# Patient Record
Sex: Female | Born: 1950
Health system: Southern US, Community
[De-identification: ages and names within clinical notes are randomized; demographics above are authoritative.]

## PROBLEM LIST (undated history)

## (undated) ENCOUNTER — Ambulatory Visit: Discharge status: Absent without leave | Payer: PPO

## (undated) DIAGNOSIS — Z9841 Cataract extraction status, right eye: Secondary | ICD-10-CM

## (undated) DIAGNOSIS — K219 Gastro-esophageal reflux disease without esophagitis: Secondary | ICD-10-CM

## (undated) DIAGNOSIS — H269 Unspecified cataract: Secondary | ICD-10-CM

## (undated) DIAGNOSIS — F32A Depression, unspecified: Secondary | ICD-10-CM

## (undated) DIAGNOSIS — F419 Anxiety disorder, unspecified: Secondary | ICD-10-CM

## (undated) DIAGNOSIS — Z9109 Other allergy status, other than to drugs and biological substances: Secondary | ICD-10-CM

## (undated) DIAGNOSIS — J45909 Unspecified asthma, uncomplicated: Secondary | ICD-10-CM

## (undated) DIAGNOSIS — E039 Hypothyroidism, unspecified: Secondary | ICD-10-CM

## (undated) DIAGNOSIS — J3801 Paralysis of vocal cords and larynx, unilateral: Secondary | ICD-10-CM

## (undated) DIAGNOSIS — J309 Allergic rhinitis, unspecified: Secondary | ICD-10-CM

## (undated) DIAGNOSIS — E785 Hyperlipidemia, unspecified: Secondary | ICD-10-CM

## (undated) DIAGNOSIS — J4540 Moderate persistent asthma, uncomplicated: Secondary | ICD-10-CM

## (undated) DIAGNOSIS — J3800 Paralysis of vocal cords and larynx, unspecified: Secondary | ICD-10-CM

## (undated) DIAGNOSIS — T8859XA Other complications of anesthesia, initial encounter: Secondary | ICD-10-CM

## (undated) DIAGNOSIS — G4700 Insomnia, unspecified: Secondary | ICD-10-CM

## (undated) DIAGNOSIS — R42 Dizziness and giddiness: Secondary | ICD-10-CM

## (undated) DIAGNOSIS — Z7982 Long term (current) use of aspirin: Secondary | ICD-10-CM

## (undated) DIAGNOSIS — G2581 Restless legs syndrome: Secondary | ICD-10-CM

## (undated) HISTORY — PX: BREAST BIOPSY: SHX20

## (undated) HISTORY — PX: APPENDECTOMY: SHX54

## (undated) HISTORY — DX: Unspecified cataract: H26.9

## (undated) HISTORY — DX: Allergic rhinitis, unspecified: J30.9

## (undated) HISTORY — DX: Moderate persistent asthma, uncomplicated: J45.40

## (undated) HISTORY — DX: Other allergy status, other than to drugs and biological substances: Z91.09

## (undated) HISTORY — DX: Paralysis of vocal cords and larynx, unilateral: J38.01

## (undated) HISTORY — DX: Gastro-esophageal reflux disease without esophagitis: K21.9

## (undated) HISTORY — DX: Hypothyroidism, unspecified: E03.9

## (undated) HISTORY — DX: Paralysis of vocal cords and larynx, unspecified: J38.00

## (undated) HISTORY — PX: VAGINAL HYSTERECTOMY: SUR661

## (undated) HISTORY — PX: OTHER SURGICAL HISTORY: SHX169

---
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
## 1983-11-11 HISTORY — PX: AUGMENTATION MAMMAPLASTY: SUR837

## 1992-11-10 HISTORY — PX: BACK SURGERY: SHX140

## 1993-11-10 DIAGNOSIS — J3801 Paralysis of vocal cords and larynx, unilateral: Secondary | ICD-10-CM

## 1993-11-10 HISTORY — DX: Paralysis of vocal cords and larynx, unilateral: J38.01

## 1993-11-10 HISTORY — PX: THYROPLASTY, TYPE I: SHX2513

## 1993-11-10 HISTORY — PX: OTHER SURGICAL HISTORY: SHX169

## 1998-02-13 ENCOUNTER — Other Ambulatory Visit: Admission: RE | Admit: 1998-02-13 | Discharge: 1998-02-13 | Payer: Self-pay | Admitting: Gynecology

## 1999-04-23 ENCOUNTER — Other Ambulatory Visit: Admission: RE | Admit: 1999-04-23 | Discharge: 1999-04-23 | Payer: Self-pay | Admitting: Gynecology

## 2000-06-22 ENCOUNTER — Other Ambulatory Visit: Admission: RE | Admit: 2000-06-22 | Discharge: 2000-06-22 | Payer: Self-pay | Admitting: Gynecology

## 2001-08-19 ENCOUNTER — Other Ambulatory Visit: Admission: RE | Admit: 2001-08-19 | Discharge: 2001-08-19 | Payer: Self-pay | Admitting: Gynecology

## 2002-12-19 ENCOUNTER — Other Ambulatory Visit: Admission: RE | Admit: 2002-12-19 | Discharge: 2002-12-19 | Payer: Self-pay | Admitting: Gynecology

## 2004-03-04 ENCOUNTER — Other Ambulatory Visit: Admission: RE | Admit: 2004-03-04 | Discharge: 2004-03-04 | Payer: Self-pay | Admitting: Gynecology

## 2016-04-02 DIAGNOSIS — Z7689 Persons encountering health services in other specified circumstances: Secondary | ICD-10-CM | POA: Diagnosis not present

## 2016-04-02 DIAGNOSIS — E785 Hyperlipidemia, unspecified: Secondary | ICD-10-CM | POA: Diagnosis not present

## 2016-04-02 DIAGNOSIS — E039 Hypothyroidism, unspecified: Secondary | ICD-10-CM | POA: Diagnosis not present

## 2016-04-02 DIAGNOSIS — Z780 Asymptomatic menopausal state: Secondary | ICD-10-CM | POA: Diagnosis not present

## 2016-04-02 DIAGNOSIS — R232 Flushing: Secondary | ICD-10-CM | POA: Diagnosis not present

## 2016-04-02 DIAGNOSIS — Z1211 Encounter for screening for malignant neoplasm of colon: Secondary | ICD-10-CM | POA: Diagnosis not present

## 2016-04-17 DIAGNOSIS — Z780 Asymptomatic menopausal state: Secondary | ICD-10-CM | POA: Diagnosis not present

## 2016-04-25 DIAGNOSIS — K219 Gastro-esophageal reflux disease without esophagitis: Secondary | ICD-10-CM | POA: Diagnosis not present

## 2016-04-25 DIAGNOSIS — Z1211 Encounter for screening for malignant neoplasm of colon: Secondary | ICD-10-CM | POA: Diagnosis not present

## 2016-04-25 DIAGNOSIS — R1310 Dysphagia, unspecified: Secondary | ICD-10-CM | POA: Diagnosis not present

## 2016-06-18 DIAGNOSIS — L259 Unspecified contact dermatitis, unspecified cause: Secondary | ICD-10-CM | POA: Diagnosis not present

## 2016-06-18 DIAGNOSIS — L298 Other pruritus: Secondary | ICD-10-CM | POA: Diagnosis not present

## 2016-07-09 DIAGNOSIS — Z0000 Encounter for general adult medical examination without abnormal findings: Secondary | ICD-10-CM | POA: Diagnosis not present

## 2016-07-09 DIAGNOSIS — E785 Hyperlipidemia, unspecified: Secondary | ICD-10-CM | POA: Diagnosis not present

## 2016-07-09 DIAGNOSIS — E039 Hypothyroidism, unspecified: Secondary | ICD-10-CM | POA: Diagnosis not present

## 2016-07-16 DIAGNOSIS — Z1211 Encounter for screening for malignant neoplasm of colon: Secondary | ICD-10-CM | POA: Diagnosis not present

## 2016-07-16 DIAGNOSIS — K5730 Diverticulosis of large intestine without perforation or abscess without bleeding: Secondary | ICD-10-CM | POA: Diagnosis not present

## 2016-08-22 DIAGNOSIS — E039 Hypothyroidism, unspecified: Secondary | ICD-10-CM | POA: Diagnosis not present

## 2016-11-25 DIAGNOSIS — Z8639 Personal history of other endocrine, nutritional and metabolic disease: Secondary | ICD-10-CM | POA: Diagnosis not present

## 2016-12-04 DIAGNOSIS — J019 Acute sinusitis, unspecified: Secondary | ICD-10-CM | POA: Diagnosis not present

## 2016-12-17 DIAGNOSIS — E039 Hypothyroidism, unspecified: Secondary | ICD-10-CM | POA: Diagnosis not present

## 2016-12-17 DIAGNOSIS — J309 Allergic rhinitis, unspecified: Secondary | ICD-10-CM | POA: Diagnosis not present

## 2016-12-24 ENCOUNTER — Ambulatory Visit
Admission: RE | Admit: 2016-12-24 | Discharge: 2016-12-24 | Disposition: A | Discharge status: Home / Self Care | Payer: PPO | Source: Ambulatory Visit | Attending: Otolaryngology | Admitting: Otolaryngology

## 2016-12-24 ENCOUNTER — Other Ambulatory Visit: Payer: Self-pay | Admitting: Otolaryngology

## 2016-12-24 DIAGNOSIS — R05 Cough: Secondary | ICD-10-CM

## 2016-12-24 DIAGNOSIS — J301 Allergic rhinitis due to pollen: Secondary | ICD-10-CM | POA: Diagnosis not present

## 2016-12-24 DIAGNOSIS — R059 Cough, unspecified: Secondary | ICD-10-CM

## 2016-12-24 DIAGNOSIS — K219 Gastro-esophageal reflux disease without esophagitis: Secondary | ICD-10-CM | POA: Diagnosis not present

## 2017-01-02 DIAGNOSIS — H2513 Age-related nuclear cataract, bilateral: Secondary | ICD-10-CM | POA: Diagnosis not present

## 2017-01-13 DIAGNOSIS — J340 Abscess, furuncle and carbuncle of nose: Secondary | ICD-10-CM | POA: Diagnosis not present

## 2017-01-13 DIAGNOSIS — J301 Allergic rhinitis due to pollen: Secondary | ICD-10-CM | POA: Diagnosis not present

## 2017-06-18 DIAGNOSIS — M549 Dorsalgia, unspecified: Secondary | ICD-10-CM | POA: Diagnosis not present

## 2017-06-18 DIAGNOSIS — M5412 Radiculopathy, cervical region: Secondary | ICD-10-CM | POA: Diagnosis not present

## 2017-06-23 ENCOUNTER — Other Ambulatory Visit: Payer: Self-pay | Admitting: Family Medicine

## 2017-07-21 DIAGNOSIS — E039 Hypothyroidism, unspecified: Secondary | ICD-10-CM | POA: Diagnosis not present

## 2017-07-21 DIAGNOSIS — L918 Other hypertrophic disorders of the skin: Secondary | ICD-10-CM | POA: Diagnosis not present

## 2017-07-21 DIAGNOSIS — E785 Hyperlipidemia, unspecified: Secondary | ICD-10-CM | POA: Diagnosis not present

## 2017-07-22 DIAGNOSIS — E785 Hyperlipidemia, unspecified: Secondary | ICD-10-CM | POA: Diagnosis not present

## 2017-07-22 DIAGNOSIS — E039 Hypothyroidism, unspecified: Secondary | ICD-10-CM | POA: Diagnosis not present

## 2017-08-17 ENCOUNTER — Other Ambulatory Visit: Payer: Self-pay | Admitting: Family Medicine

## 2017-08-18 ENCOUNTER — Other Ambulatory Visit: Payer: Self-pay | Admitting: Family Medicine

## 2017-08-18 DIAGNOSIS — N644 Mastodynia: Secondary | ICD-10-CM

## 2017-08-19 ENCOUNTER — Other Ambulatory Visit: Payer: Self-pay | Admitting: Family Medicine

## 2017-08-19 DIAGNOSIS — N644 Mastodynia: Secondary | ICD-10-CM

## 2017-08-31 ENCOUNTER — Inpatient Hospital Stay
Admission: RE | Admit: 2017-08-31 | Discharge: 2017-08-31 | Disposition: A | Discharge status: Home / Self Care | Payer: Self-pay | Source: Ambulatory Visit | Attending: *Deleted | Admitting: *Deleted

## 2017-08-31 ENCOUNTER — Other Ambulatory Visit: Payer: Self-pay | Admitting: *Deleted

## 2017-08-31 DIAGNOSIS — Z9289 Personal history of other medical treatment: Secondary | ICD-10-CM

## 2017-09-08 ENCOUNTER — Ambulatory Visit
Admission: RE | Admit: 2017-09-08 | Discharge: 2017-09-08 | Disposition: A | Discharge status: Home / Self Care | Payer: PPO | Source: Ambulatory Visit | Attending: Family Medicine | Admitting: Family Medicine

## 2017-09-08 ENCOUNTER — Encounter: Payer: Self-pay | Admitting: Radiology

## 2017-09-08 DIAGNOSIS — N644 Mastodynia: Secondary | ICD-10-CM

## 2017-09-08 DIAGNOSIS — R922 Inconclusive mammogram: Secondary | ICD-10-CM | POA: Diagnosis not present

## 2017-10-28 DIAGNOSIS — E785 Hyperlipidemia, unspecified: Secondary | ICD-10-CM | POA: Diagnosis not present

## 2017-11-17 DIAGNOSIS — M2012 Hallux valgus (acquired), left foot: Secondary | ICD-10-CM | POA: Diagnosis not present

## 2017-11-17 DIAGNOSIS — M722 Plantar fascial fibromatosis: Secondary | ICD-10-CM | POA: Diagnosis not present

## 2017-12-08 DIAGNOSIS — M722 Plantar fascial fibromatosis: Secondary | ICD-10-CM | POA: Diagnosis not present

## 2018-01-26 DIAGNOSIS — M722 Plantar fascial fibromatosis: Secondary | ICD-10-CM | POA: Diagnosis not present

## 2018-03-19 DIAGNOSIS — R21 Rash and other nonspecific skin eruption: Secondary | ICD-10-CM | POA: Diagnosis not present

## 2018-04-21 DIAGNOSIS — L298 Other pruritus: Secondary | ICD-10-CM | POA: Diagnosis not present

## 2018-06-23 ENCOUNTER — Encounter: Payer: Self-pay | Admitting: Obstetrics and Gynecology

## 2018-06-23 ENCOUNTER — Other Ambulatory Visit (HOSPITAL_COMMUNITY)
Admission: RE | Admit: 2018-06-23 | Discharge: 2018-06-23 | Disposition: A | Discharge status: Home / Self Care | Payer: PPO | Source: Ambulatory Visit | Attending: Obstetrics and Gynecology | Admitting: Obstetrics and Gynecology

## 2018-06-23 ENCOUNTER — Ambulatory Visit (INDEPENDENT_AMBULATORY_CARE_PROVIDER_SITE_OTHER): Payer: PPO | Admitting: Obstetrics and Gynecology

## 2018-06-23 VITALS — BP 122/74 | HR 96 | Ht 61.0 in | Wt 138.0 lb

## 2018-06-23 DIAGNOSIS — Z01411 Encounter for gynecological examination (general) (routine) with abnormal findings: Secondary | ICD-10-CM

## 2018-06-23 DIAGNOSIS — Z202 Contact with and (suspected) exposure to infections with a predominantly sexual mode of transmission: Secondary | ICD-10-CM | POA: Insufficient documentation

## 2018-06-23 DIAGNOSIS — Z780 Asymptomatic menopausal state: Secondary | ICD-10-CM | POA: Diagnosis not present

## 2018-06-23 DIAGNOSIS — Z1211 Encounter for screening for malignant neoplasm of colon: Secondary | ICD-10-CM

## 2018-06-23 DIAGNOSIS — N393 Stress incontinence (female) (male): Secondary | ICD-10-CM

## 2018-06-23 DIAGNOSIS — Z1231 Encounter for screening mammogram for malignant neoplasm of breast: Secondary | ICD-10-CM

## 2018-06-23 DIAGNOSIS — Z90710 Acquired absence of both cervix and uterus: Secondary | ICD-10-CM | POA: Diagnosis not present

## 2018-06-23 DIAGNOSIS — N766 Ulceration of vulva: Secondary | ICD-10-CM

## 2018-06-23 DIAGNOSIS — Z1239 Encounter for other screening for malignant neoplasm of breast: Secondary | ICD-10-CM

## 2018-06-23 MED ORDER — ESTRADIOL 0.5 MG PO TABS: 0.5000 mg | ORAL_TABLET | Freq: Every day | ORAL | 3 refills | Status: DC

## 2018-06-23 NOTE — Progress Notes (Signed)
ANNUAL PREVENTATIVE CARE GYN  ENCOUNTER NOTE  Subjective:       Gwendolyn Holmes is a 67 y.o. [id number] female here for a routine annual gynecologic exam.  Current complaints: 1.  Wants std screen  2. Right side vaginal area- itchy at times blister  67 year old monogamous female para 1-0-0-1, status post TVH at age 67 for cervical dysplasia, with no subsequent abnormal Pap smear since, presents for establishment of care.  Patient had previously seen [facility]. Greta [facility]Bowie [facility] until his retirement this past year. Patient has history of ERT starting at age 67 through [age] when she was taking Premarin.  She had a 2-year hiatus from estrogen therapy until restarting estradiol 0.5 mg daily at age 67 to the present time.  She reports excellent control of vasomotor symptoms on ERT. She has never had a DEXA scan. Colonoscopy in 2017 was normal.  She reports normal bowel function. Genitourinary history: Urinary frequency-8 times daily Nocturia-once nightly No significant urge symptoms History of stress urinary incontinence; she does wear pads.  Patient reports history of intermittent ulcers noted on the buttocks region as well as near the vulva periodically, often associated with stress.  She has no known prior diagnosis of herpes genitalis.  She has had fever blisters in the past.  Most recently, she is concerned about her partner fidelity and she would like an STD screen.  She occasionally has right-sided vulvar itching and an occasional ulcer formation.  No activity is noted today.   Gynecologic History No LMP recorded. Patient has had a hysterectomy. Contraception: Status post TVH Last Pap:2015 . Results were: normal Last mammogram:08/2017 birad 2. Results were: normal DEXA scan-ordered 2019 Colonoscopy-2017 normal ERT: Premarin age [age]; estradiol 0.5 mg daily age 67 to present  Obstetric History OB History  Gravida Para Term Preterm AB Living  1 1 1     1   SAB TAB Ectopic Multiple  Live Births          1    # Outcome Date GA Lbr Len/2nd Weight Sex Delivery Anes PTL Lv  1 Term 1969   6 lb 6.4 oz (2.903 kg) M Vag-Spont   LIV    Past Medical History:  Diagnosis Date  . Environmental allergies   . Hypothyroidism     Past Surgical History:  Procedure Laterality Date  . APPENDECTOMY    . AUGMENTATION MAMMAPLASTY Bilateral 1985  . OTHER SURGICAL HISTORY     vocal cord   . VAGINAL HYSTERECTOMY     LSH    Current Outpatient Medications on File Prior to Visit  Medication Sig Dispense Refill  . aspirin 325 MG tablet Take 325 mg by mouth daily.    Marland Kitchen. azelastine (ASTELIN) 0.1 % nasal spray Place into both nostrils 2 (two) times daily. Use in each nostril as directed    . co-enzyme Q-10 30 MG capsule Take 30 mg by mouth 3 (three) times daily.    Marland Kitchen. estradiol (ESTRACE) 0.5 MG tablet TAKE 1 TABLET BY MOUTH ONCE DAILY    . levothyroxine (SYNTHROID, LEVOTHROID) 50 MCG tablet TAKE 1 TABLET BY MOUTH EVERY DAY ON AN EMPTY STOMACH AT LEAST 30 TO 60 MINUTES BEFORE BREAKFAST    . montelukast (SINGULAIR) 10 MG tablet TK 1 T PO NIGHTLY  11  . Red Yeast Rice 600 MG CAPS Take by mouth.     No current facility-administered medications on file prior to visit.     Allergies  Allergen Reactions  . Latex  Rash    Social History   Socioeconomic History  . Marital status: Legally Separated    Spouse name: Not on file  . Number of children: Not on file  . Years of education: Not on file  . Highest education level: Not on file  Occupational History  . Not on file  Social Needs  . Financial resource strain: Not on file  . Food insecurity:    Worry: Not on file    Inability: Not on file  . Transportation needs:    Medical: Not on file    Non-medical: Not on file  Tobacco Use  . Smoking status: Not on file  Substance and Sexual Activity  . Alcohol use: Not on file  . Drug use: Not on file  . Sexual activity: Not on file  Lifestyle  . Physical activity:    Days per week:  Not on file    Minutes per session: Not on file  . Stress: Not on file  Relationships  . Social connections:    Talks on phone: Not on file    Gets together: Not on file    Attends religious service: Not on file    Active member of club or organization: Not on file    Attends meetings of clubs or organizations: Not on file    Relationship status: Not on file  . Intimate partner violence:    Fear of current or ex partner: Not on file    Emotionally abused: Not on file    Physically abused: Not on file    Forced sexual activity: Not on file  Other Topics Concern  . Not on file  Social History Narrative  . Not on file    Family History  Problem Relation Age of Onset  . Breast cancer Neg Hx     The following portions of the patient's history were reviewed and updated as appropriate: allergies, current medications, past family history, past medical history, past social history, past surgical history and problem list.  Review of Systems Review of Systems  Constitutional: Negative.   HENT: Negative.   Eyes: Negative.   Respiratory: Negative.   Cardiovascular: Negative.   Gastrointestinal: Negative.   Genitourinary:       Stress incontinence, wears pads  Musculoskeletal: Negative.   Skin:       Occasional blister right side of perineum associated with itching. Occasional ulcers noted on backside (gluteus)  Neurological: Negative.   Endo/Heme/Allergies: Negative.   Psychiatric/Behavioral: Negative.       Objective:   BP 122/74   Pulse 96   Ht 5\' 1"  (1.549 m)   Wt 138 lb (62.6 kg)   BMI 26.07 kg/m  CONSTITUTIONAL: Well-developed, well-nourished female in no acute distress.  PSYCHIATRIC: Normal mood and affect. Normal behavior. Normal judgment and thought content. NEUROLGIC: Alert and oriented to person, place, and time. Normal muscle tone coordination. No cranial nerve deficit noted. HENT:  Normocephalic, atraumatic, External right and left ear normal. Oropharynx is  clear and moist EYES: Conjunctivae and EOM are normal. No scleral icterus.  NECK: Normal range of motion, supple, no masses.  Normal thyroid.  SKIN: Skin is warm and dry. No rash noted. Not diaphoretic. No erythema. No pallor. CARDIOVASCULAR: Normal heart rate noted, regular rhythm, no murmur. RESPIRATORY: Clear to auscultation bilaterally. Effort and breath sounds normal, no problems with respiration noted. BREASTS: Symmetric in size. No masses, skin changes, nipple drainage, or lymphadenopathy. ABDOMEN: Soft, normal bowel sounds, no distention noted.  No  tenderness, rebound or guarding.  BLADDER: Normal PELVIC:  External Genitalia: Minimal atrophic changes; there is a 3 mm weepy ulcer noted at the apex of the clitoral hood  BUS: Normal  Vagina: Normal estrogen effect; no discharge; no lesions  Cervix: Surgically absent  Uterus: Surgically absent  Adnexa: Normal; nonpalpable nontender  RV: External Exam NormaI, No Rectal Masses and Normal Sphincter tone  MUSCULOSKELETAL: Normal range of motion. No tenderness.  No cyanosis, clubbing, or edema.  2+ distal pulses. LYMPHATIC: No Axillary, Supraclavicular, or Inguinal Adenopathy.    Assessment:   Annual gynecologic examination 67 y.o. Contraception: status post hysterectomy bmi- 26 Surgical menopause, asymptomatic with ERT History of occasional genital ulcer and ulcer of buttock outbreak without history of prior herpes genitalis SUI  Plan:  Pap: Not needed Mammogram: Ordered Stool Guaiac Testing:  Ordered Labs: per pt request std panel Routine preventative health maintenance measures emphasized: Exercise/Diet/Weight control, Tobacco Warnings, Alcohol/Substance use risks and Safe Sex STD screen.  HSV culture is also taken. Refill estradiol 1 mg day Calcium 600 mg twice a day and vitamin D 400 international units twice a day DEXA scan PT consult for stress urinary incontinence Return to Clinic - 1 Year   Crystal Charenton, [place]   Herold Harms, MD  Note: This dictation was prepared with Dragon dictation along with smaller phrase technology. Any transcriptional errors that result from this process are unintentional.

## 2018-06-23 NOTE — Patient Instructions (Addendum)
1.  No Pap smear is needed. 2.  Mammogram is ordered. 3.  DEXA scan is ordered. 4.  Stool guaiac cards are given for colon cancer screening 5.  Screening labs are to be done through primary care-Dr. Kary Kos 6.  Estradiol is refilled for 1 year. 7.  Recommend calcium 600 mg twice a day and vitamin D 400 international units twice a day for osteoporosis prevention 8.  STD screen is performed today.  Results will be made available.  HSV culture is done at 3 mm clitoral hood ulceration 9.  Return in 1 year for annual exam. 10.  Physical therapy consult for stress urinary incontinence is ordered.   Health Maintenance for Postmenopausal Women Menopause is a normal process in which your reproductive ability comes to an end. This process happens gradually over a span of months to years, usually between the ages of 42 and 56. Menopause is complete when you have missed 12 consecutive menstrual periods. It is important to talk with your health care provider about some of the most common conditions that affect postmenopausal women, such as heart disease, cancer, and bone loss (osteoporosis). Adopting a healthy lifestyle and getting preventive care can help to promote your health and wellness. Those actions can also lower your chances of developing some of these common conditions. What should I know about menopause? During menopause, you may experience a number of symptoms, such as:  Moderate-to-severe hot flashes.  Night sweats.  Decrease in sex drive.  Mood swings.  Headaches.  Tiredness.  Irritability.  Memory problems.  Insomnia.  Choosing to treat or not to treat menopausal changes is an individual decision that you make with your health care provider. What should I know about hormone replacement therapy and supplements? Hormone therapy products are effective for treating symptoms that are associated with menopause, such as hot flashes and night sweats. Hormone replacement carries  certain risks, especially as you become older. If you are thinking about using estrogen or estrogen with progestin treatments, discuss the benefits and risks with your health care provider. What should I know about heart disease and stroke? Heart disease, heart attack, and stroke become more likely as you age. This may be due, in part, to the hormonal changes that your body experiences during menopause. These can affect how your body processes dietary fats, triglycerides, and cholesterol. Heart attack and stroke are both medical emergencies. There are many things that you can do to help prevent heart disease and stroke:  Have your blood pressure checked at least every 1-2 years. High blood pressure causes heart disease and increases the risk of stroke.  If you are 109-41 years old, ask your health care provider if you should take aspirin to prevent a heart attack or a stroke.  Do not use any tobacco products, including cigarettes, chewing tobacco, or electronic cigarettes. If you need help quitting, ask your health care provider.  It is important to eat a healthy diet and maintain a healthy weight. ? Be sure to include plenty of vegetables, fruits, low-fat dairy products, and lean protein. ? Avoid eating foods that are high in solid fats, added sugars, or salt (sodium).  Get regular exercise. This is one of the most important things that you can do for your health. ? Try to exercise for at least 150 minutes each week. The type of exercise that you do should increase your heart rate and make you sweat. This is known as moderate-intensity exercise. ? Try to do strengthening exercises at  least twice each week. Do these in addition to the moderate-intensity exercise.  Know your numbers.Ask your health care provider to check your cholesterol and your blood glucose. Continue to have your blood tested as directed by your health care provider.  What should I know about cancer screening? There are  several types of cancer. Take the following steps to reduce your risk and to catch any cancer development as early as possible. Breast Cancer  Practice breast self-awareness. ? This means understanding how your breasts normally appear and feel. ? It also means doing regular breast self-exams. Let your health care provider know about any changes, no matter how small.  If you are 34 or older, have a clinician do a breast exam (clinical breast exam or CBE) every year. Depending on your age, family history, and medical history, it may be recommended that you also have a yearly breast X-ray (mammogram).  If you have a family history of breast cancer, talk with your health care provider about genetic screening.  If you are at high risk for breast cancer, talk with your health care provider about having an MRI and a mammogram every year.  Breast cancer (BRCA) gene test is recommended for women who have family members with BRCA-related cancers. Results of the assessment will determine the need for genetic counseling and BRCA1 and for BRCA2 testing. BRCA-related cancers include these types: ? Breast. This occurs in males or females. ? Ovarian. ? Tubal. This may also be called fallopian tube cancer. ? Cancer of the abdominal or pelvic lining (peritoneal cancer). ? Prostate. ? Pancreatic.  Cervical, Uterine, and Ovarian Cancer Your health care provider may recommend that you be screened regularly for cancer of the pelvic organs. These include your ovaries, uterus, and vagina. This screening involves a pelvic exam, which includes checking for microscopic changes to the surface of your cervix (Pap test).  For women ages 21-65, health care providers may recommend a pelvic exam and a Pap test every three years. For women ages 65-65, they may recommend the Pap test and pelvic exam, combined with testing for human papilloma virus (HPV), every five years. Some types of HPV increase your risk of cervical  cancer. Testing for HPV may also be done on women of any age who have unclear Pap test results.  Other health care providers may not recommend any screening for nonpregnant women who are considered low risk for pelvic cancer and have no symptoms. Ask your health care provider if a screening pelvic exam is right for you.  If you have had past treatment for cervical cancer or a condition that could lead to cancer, you need Pap tests and screening for cancer for at least 20 years after your treatment. If Pap tests have been discontinued for you, your risk factors (such as having a new sexual partner) need to be reassessed to determine if you should start having screenings again. Some women have medical problems that increase the chance of getting cervical cancer. In these cases, your health care provider may recommend that you have screening and Pap tests more often.  If you have a family history of uterine cancer or ovarian cancer, talk with your health care provider about genetic screening.  If you have vaginal bleeding after reaching menopause, tell your health care provider.  There are currently no reliable tests available to screen for ovarian cancer.  Lung Cancer Lung cancer screening is recommended for adults 51-15 years old who are at high risk for lung cancer  because of a history of smoking. A yearly low-dose CT scan of the lungs is recommended if you:  Currently smoke.  Have a history of at least 30 pack-years of smoking and you currently smoke or have quit within the past 15 years. A pack-year is smoking an average of one pack of cigarettes per day for one year.  Yearly screening should:  Continue until it has been 15 years since you quit.  Stop if you develop a health problem that would prevent you from having lung cancer treatment.  Colorectal Cancer  This type of cancer can be detected and can often be prevented.  Routine colorectal cancer screening usually begins at age 82  and continues through age 37.  If you have risk factors for colon cancer, your health care provider may recommend that you be screened at an earlier age.  If you have a family history of colorectal cancer, talk with your health care provider about genetic screening.  Your health care provider may also recommend using home test kits to check for hidden blood in your stool.  A small camera at the end of a tube can be used to examine your colon directly (sigmoidoscopy or colonoscopy). This is done to check for the earliest forms of colorectal cancer.  Direct examination of the colon should be repeated every 5-10 years until age 41. However, if early forms of precancerous polyps or small growths are found or if you have a family history or genetic risk for colorectal cancer, you may need to be screened more often.  Skin Cancer  Check your skin from head to toe regularly.  Monitor any moles. Be sure to tell your health care provider: ? About any new moles or changes in moles, especially if there is a change in a mole's shape or color. ? If you have a mole that is larger than the size of a pencil eraser.  If any of your family members has a history of skin cancer, especially at a young age, talk with your health care provider about genetic screening.  Always use sunscreen. Apply sunscreen liberally and repeatedly throughout the day.  Whenever you are outside, protect yourself by wearing long sleeves, pants, a wide-brimmed hat, and sunglasses.  What should I know about osteoporosis? Osteoporosis is a condition in which bone destruction happens more quickly than new bone creation. After menopause, you may be at an increased risk for osteoporosis. To help prevent osteoporosis or the bone fractures that can happen because of osteoporosis, the following is recommended:  If you are 40-16 years old, get at least 1,000 mg of calcium and at least 600 mg of vitamin D per day.  If you are older than  age 86 but younger than age 73, get at least 1,200 mg of calcium and at least 600 mg of vitamin D per day.  If you are older than age 58, get at least 1,200 mg of calcium and at least 800 mg of vitamin D per day.  Smoking and excessive alcohol intake increase the risk of osteoporosis. Eat foods that are rich in calcium and vitamin D, and do weight-bearing exercises several times each week as directed by your health care provider. What should I know about how menopause affects my mental health? Depression may occur at any age, but it is more common as you become older. Common symptoms of depression include:  Low or sad mood.  Changes in sleep patterns.  Changes in appetite or eating patterns.  Feeling an overall lack of motivation or enjoyment of activities that you previously enjoyed.  Frequent crying spells.  Talk with your health care provider if you think that you are experiencing depression. What should I know about immunizations? It is important that you get and maintain your immunizations. These include:  Tetanus, diphtheria, and pertussis (Tdap) booster vaccine.  Influenza every year before the flu season begins.  Pneumonia vaccine.  Shingles vaccine.  Your health care provider may also recommend other immunizations. This information is not intended to replace advice given to you by your health care provider. Make sure you discuss any questions you have with your health care provider. Document Released: 12/19/2005 Document Revised: 05/16/2016 Document Reviewed: 07/31/2015 Elsevier Interactive Patient Education  2018 [employer].

## 2018-06-24 ENCOUNTER — Encounter: Payer: Self-pay | Admitting: Obstetrics and Gynecology

## 2018-06-24 ENCOUNTER — Other Ambulatory Visit: Payer: Self-pay | Admitting: Obstetrics and Gynecology

## 2018-06-24 DIAGNOSIS — N766 Ulceration of vulva: Secondary | ICD-10-CM | POA: Diagnosis not present

## 2018-06-24 DIAGNOSIS — B009 Herpesviral infection, unspecified: Secondary | ICD-10-CM | POA: Insufficient documentation

## 2018-06-24 DIAGNOSIS — Z1159 Encounter for screening for other viral diseases: Secondary | ICD-10-CM

## 2018-06-24 LAB — HSV(HERPES SIMPLEX VRS) I + II AB-IGG
HSV 1 GLYCOPROTEIN G AB, IGG: 26.8 {index} — AB (ref 0.00–0.90)
HSV 2 IgG, Type Spec: 16.4 index — ABNORMAL HIGH (ref 0.00–0.90)

## 2018-06-24 LAB — HIV ANTIBODY (ROUTINE TESTING W REFLEX): HIV SCREEN 4TH GENERATION: NONREACTIVE

## 2018-06-24 LAB — RPR: RPR: NONREACTIVE

## 2018-06-24 LAB — HEPATITIS B SURFACE ANTIGEN: Hepatitis B Surface Ag: NEGATIVE

## 2018-06-24 LAB — HEPATITIS C ANTIBODY: Hep C Virus Ab: <0.1 s/co ratio (ref 0.0–0.9)

## 2018-06-25 LAB — URINE CYTOLOGY ANCILLARY ONLY
CHLAMYDIA, DNA PROBE: NEGATIVE
NEISSERIA GONORRHEA: NEGATIVE

## 2018-06-27 LAB — HERPES SIMPLEX VIRUS CULTURE

## 2018-07-20 ENCOUNTER — Ambulatory Visit: Payer: PPO | Attending: Obstetrics and Gynecology

## 2018-07-20 ENCOUNTER — Other Ambulatory Visit: Payer: Self-pay

## 2018-07-20 DIAGNOSIS — M62838 Other muscle spasm: Secondary | ICD-10-CM | POA: Insufficient documentation

## 2018-07-20 DIAGNOSIS — R278 Other lack of coordination: Secondary | ICD-10-CM | POA: Insufficient documentation

## 2018-07-20 DIAGNOSIS — N941 Unspecified dyspareunia: Secondary | ICD-10-CM | POA: Diagnosis not present

## 2018-07-20 DIAGNOSIS — N393 Stress incontinence (female) (male): Secondary | ICD-10-CM | POA: Diagnosis not present

## 2018-07-20 NOTE — Patient Instructions (Signed)
Child's Pose Pelvic Floor Lengthening    Sit in knee-chest position and reach arms forward. Separate knees for comfort. Hold position for _5__ breaths. Repeat _2-3__ times. Do _1-2__ times per day.   

## 2018-07-20 NOTE — Therapy (Signed)
[place] [facility] MAIN [facility] SERVICES [address] [place], [place], [address] Phone: [phone]   Fax:  [phone]  Physical Therapy Evaluation  Patient Details  Name: Gwendolyn Holmes MRN: [id number] Date of Birth: 09-30-1951 Referring Provider: defrancesco   Encounter Date: 07/20/2018    Past Medical History:  Diagnosis Date  . Environmental allergies   . Hypothyroidism     Past Surgical History:  Procedure Laterality Date  . APPENDECTOMY    . AUGMENTATION MAMMAPLASTY Bilateral 1985  . OTHER SURGICAL HISTORY     vocal cord   . VAGINAL HYSTERECTOMY     LSH    There were no vitals filed for this visit.    Pelvic Floor Physical Therapy Evaluation and Assessment  SCREENING  Falls in last 6 mo: no  Patient's communication preference:   Red Flags:  Have you had any night sweats? Takes estradiol for menopause. Mild occasional sweats Unexplained weight loss? no Saddle anesthesia? no Unexplained changes in bowel or bladder habits? no  SUBJECTIVE  Patient reports: Started having leakage issues while pregnant and it was mild with stress for years. Now that she is "older" it will happen more often more regularly, worst over the last 2-3 years. Will use a thin panty-liner when she is active, nothing at home. Wears a #4 pad when on her 2.4 mile hike.   Only has LBP "ache" if she does "extra curricular" activity such as heavy gardening.  Had a ruptured disk in cervical spine and had to have an allograft from hip to neck.  Has a little area that does not feel "right" in the vagina and thinks it may be where her episiotomy scar was. It was often itchy along the scar for years but it went away for many many years. It feels rough.  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  Recent Procedures/Tests/Findings:  Plantar fasciitis on R, bone spur on L, pain in B elbows (joint ache), is going to have imaging done  this month.  Obstetrical History: Had partial hysterectomy when ~ 25 due to dysplasia. 1 vaginal with episiotomy "biggest he had ever done"  Gynecological History: Genital Herpes  Urinary History: See Pt. history  Gastrointestinal History: regular  Sexual activity/pain: Has had pain with intercourse  For the last few years, thinks it may not be due to dryness.  Location of pain: LBP Current pain:  0/10  Max pain:  3-4/10 Least pain:  0/10 Nature of pain: ache  Patient Goals: To not have to wear a pad at all.   OBJECTIVE  Posture/Observations: Deferred to next visit Sitting:  Standing:   Palpation/Segmental Motion/Joint Play: Deferred to next visit   Special tests:  Deferred to next visit    Range of Motion/Flexibilty: Deferred to next visit Spine: Hips:   Strength/MMT: Deferred to next visit  LE MMT  LE MMT Left Right  Hip flex:  (L2) /5 /5  Hip ext: /5 /5  Hip abd: /5 /5  Hip add: /5 /5  Hip IR /5 /5  Hip ER /5 /5     Abdominal:  Deferred to next visit Palpation: Diastasis:  Pelvic Floor External Exam: Introitus Appears: mild gaping Skin integrity: Scar at L 5 o'clock position Palpation: TTP through B STP and R IC externally Cough: paradoxical, uses glutes and adductors with cough when not cued. Prolapse visible?: no Scar mobility: decreased mobility, scar is off to 5 o'clock angle.  Internal Vaginal Exam: Strength (PERF): 4+/5,  5 seconds, 2 times Symmetry: symmetrical Palpation: TTP through all PFM with posterior PR/PC less than others B. Prolapse: visible ~ 1/2 cm above introitus with pressure   Internal Rectal Exam: deferred indefinately Strength (PERF): Symmetry: Palpation: Prolapse:   Gait Analysis: Deferred to next visit  INTERVENTIONS THIS SESSION: Self-care: Educated on the structure and function of the pelvic floor in relation to their symptoms as well as the POC, and initial HEP in order to set patient expectations and  understanding from which we will build on in the future sessions.  Therex: educated on diaphragmatic breathing and child's pose to decrease PFM tightness and allow for improved relaxation with bearing down to facilitate improved ease of BM.   Total time: 60 min.                 Objective measurements completed on examination: See above findings.                PT Short Term Goals - 07/22/18 1317      PT SHORT TERM GOAL #1   Title  Patient will demonstrate a coordinated contraction, relaxation, and bulge of the pelvic floor muscles to demonstrate functional recruitment and motion and allow for further strengthening.    Baseline  Pt. has difficulty achieving "bulge" due to spasm    Time  6    Period  Weeks    Status  New    Target Date  09/02/18      PT SHORT TERM GOAL #2   Title  Patient will demonstrate functional recruitment of TA with breathing, sit-to-stand, squatting/lifting, and walking to allow for improved pelvic brace coordination, improved balance, and decreased downward pressure on the pelvic organs.    Time  6    Period  Weeks    Status  New    Target Date  09/02/18      PT SHORT TERM GOAL #3   Title  Patient will report consistent use of foot-stool (squatty-potty) for positioning with BM to decrease pressure on pelvic organs with BM.    Baseline  Prolapse visible with increased pressure.    Time  6    Period  Weeks    Status  New    Target Date  09/02/18        PT Long Term Goals - 07/23/18 0645      PT LONG TERM GOAL #1   Title  Patient will report no episodes of SUI over the course of the prior two weeks to demonstrate improved functional ability.    Baseline  Using thin pad with every-day activity and #4 pad with walking 2.4 miles    Time  10    Period  Weeks    Status  New    Target Date  10/01/18      PT LONG TERM GOAL #2   Title  Patient will report no pain with intercourse to demonstrate improved functional ability.     Baseline  Intercourse is painful enough to require stopping.    Time  10    Period  Weeks    Status  New    Target Date  10/01/18      PT LONG TERM GOAL #3   Title  Patient will demonstrate functional recruitment of TA with breathing, sit-to-stand, squatting/lifting, and walking to allow for improved pelvic brace coordination, improved balance, and decreased downward pressure on the pelvic organs.    Time  10    Period  Weeks  Status  New    Target Date  10/01/18             Plan - 07/22/18 1252    Clinical Impression Statement  Pt. is a 67 y/o female who presents today with cheif c/o stress incontinence that is worst with long duration walking, cough, or sneeze. Her relevant PMH includes cervical allograft for bulging disk, B plantar fasciitis, mild LBP with activity, vaginal delivery with large episiotomy and partial hysterectomy. Her Clinical exam reveals spasms throughout the PFM asw well as decreased episiotomy scar mobility and mild prolapse of the anterior vaginal wall. She will benefit from skilled pelvic PT to address the noted defecits and to continue to assess for and address other potential causes of SUI and LBP.     Clinical Presentation  Stable    Clinical Decision Making  Moderate    Rehab Potential  Good    PT Frequency  1x / week    PT Duration  12 weeks    PT Treatment/Interventions  ADLs/Self Care Home Management;Biofeedback;Electrical Stimulation;Functional mobility training;Therapeutic activities;Therapeutic exercise;Patient/family education;Neuromuscular re-education;Manual techniques;Scar mobilization;Taping;Dry needling;Spinal Manipulations;Joint Manipulations    PT Next Visit Plan  Assess spine and Hip ROM and strength, educate on self posterior fourchette massage and perform internal TP release if pelvis already in good alignment.     Consulted and Agree with Plan of Care  Patient       Patient will benefit from skilled therapeutic intervention in order  to improve the following deficits and impairments:  Increased fascial restricitons, Improper body mechanics, Pain, Decreased coordination, Decreased mobility, Decreased scar mobility, Increased muscle spasms, Postural dysfunction, Decreased endurance, Decreased strength, Decreased range of motion  Visit Diagnosis: Other muscle spasm  Other lack of coordination  Stress incontinence of urine  Dyspareunia in female     Problem List Patient Active Problem List   Diagnosis Date Noted  . HSV 1 IgG positive 06/24/2018  . HSV-2 IgG positive 06/24/2018  . Menopause 06/23/2018  . Status post vaginal hysterectomy 06/23/2018  . STD exposure 06/23/2018   Cleophus Molt DPT, ATC Cleophus Molt 07/23/2018, 6:53 AM  [place] [facility] MAIN [facility] SERVICES [address] [place], [place], [address] Phone: [phone]   Fax:  [phone]  Name: Gwendolyn Holmes MRN: [id number] Date of Birth: [birth year]

## 2018-07-27 ENCOUNTER — Ambulatory Visit: Payer: PPO

## 2018-07-27 DIAGNOSIS — M62838 Other muscle spasm: Secondary | ICD-10-CM | POA: Diagnosis not present

## 2018-07-27 DIAGNOSIS — N941 Unspecified dyspareunia: Secondary | ICD-10-CM

## 2018-07-27 DIAGNOSIS — R278 Other lack of coordination: Secondary | ICD-10-CM

## 2018-07-27 DIAGNOSIS — N393 Stress incontinence (female) (male): Secondary | ICD-10-CM

## 2018-07-27 NOTE — Therapy (Signed)
[place] MAIN [facility] SERVICES [address] [place], [place], [address] Phone: [phone]   Fax:  [phone]  Physical Therapy Treatment  Patient Details  Name: Gwendolyn Holmes MRN: [id number] Date of Birth: 12-11-1950 Referring Provider: Hassell Done Defrancesco   Encounter Date: 07/27/2018  PT End of Session - 07/28/18 0949    Visit Number  2    Number of Visits  10    Date for PT Re-Evaluation  09/29/18    Authorization Type  Medicare    PT Start Time  1500    PT Stop Time  1600    PT Time Calculation (min)  60 min    Activity Tolerance  Patient tolerated treatment well    Behavior During Therapy  [facility] for tasks assessed/performed       Past Medical History:  Diagnosis Date  . Environmental allergies   . Hypothyroidism     Past Surgical History:  Procedure Laterality Date  . APPENDECTOMY    . AUGMENTATION MAMMAPLASTY Bilateral 1985  . OTHER SURGICAL HISTORY     vocal cord   . VAGINAL HYSTERECTOMY     LSH    There were no vitals filed for this visit.    Pelvic Floor Physical Therapy Treatment Note  SCREENING  Changes in medications, allergies, or medical history?: no    SUBJECTIVE  Patient reports: Has been trying to do her exercises at home but is unsure whether she is doing breathing correctly because it feels unnatural.  *Has Fibromyalgia but it has not been bad lately. She has pain in her elbows (ache)    * Feels like she cannot breathe as well when she sleeps on her R side.  Precautions: Genetal herpes  Pain update:  Location of pain: LBP with walking Current pain:  1/10  Max pain:  2/10 Least pain:  0/10 Nature of pain: ache tightness  Patient Goals: To not have to wear a pad at all.   OBJECTIVE  Changes in: Posture/Observations:  Anterior pelvic tilt in seated.  L ASIS high (slight anterior pelvic tilt , huperlordosis   Range of Motion/Flexibilty:  Decreased mobility through sacrum and  thoracic spine.  Decreased Hip extension B.  Palpation: TTP through B Lumbar paraspinals, QL, Glute min. And Piriformis as well As Psoas and Iliacus.   INTERVENTIONS THIS SESSION: Manual: Assessed spine and hip ROM and mobility for further POC development. Therex: Educated on and practiced pelvic tilts and low-back stretch to decrease spasm and allow for decreased tension on lumbar nerve roots for decreased incontinence and LBP. Theract: educated on and practiced how seated and standing posture effects ability of PFM to work optimally as well as how to support feet properly to minimize stress up the chain and decrease muscular imbalance to allow for optimal PFM recruitment and decreased back pain. Educated on Exhale with exertion to decrease stress on PFM and allow for decreased SUI.   Total time: 60 min.                          PT Education - 07/28/18 0948    Education Details  See Pt. Instructions and Interventions this session    Person(s) Educated  Patient    Methods  Explanation;Demonstration;Verbal cues;Handout    Comprehension  Verbalized understanding;Returned demonstration;Verbal cues required       PT Short Term Goals - 07/22/18 1317      PT SHORT TERM GOAL #1  Title  Patient will demonstrate a coordinated contraction, relaxation, and bulge of the pelvic floor muscles to demonstrate functional recruitment and motion and allow for further strengthening.    Baseline  Pt. has difficulty achieving "bulge" due to spasm    Time  6    Period  Weeks    Status  New    Target Date  09/02/18      PT SHORT TERM GOAL #2   Title  Patient will demonstrate functional recruitment of TA with breathing, sit-to-stand, squatting/lifting, and walking to allow for improved pelvic brace coordination, improved balance, and decreased downward pressure on the pelvic organs.    Time  6    Period  Weeks    Status  New    Target Date  09/02/18      PT SHORT TERM GOAL #3    Title  Patient will report consistent use of foot-stool (squatty-potty) for positioning with BM to decrease pressure on pelvic organs with BM.    Baseline  Prolapse visible with increased pressure.    Time  6    Period  Weeks    Status  New    Target Date  09/02/18        PT Long Term Goals - 07/23/18 0645      PT LONG TERM GOAL #1   Title  Patient will report no episodes of SUI over the course of the prior two weeks to demonstrate improved functional ability.    Baseline  Using thin pad with every-day activity and #4 pad with walking 2.4 miles    Time  10    Period  Weeks    Status  New    Target Date  10/01/18      PT LONG TERM GOAL #2   Title  Patient will report no pain with intercourse to demonstrate improved functional ability.    Baseline  Intercourse is painful enough to require stopping.    Time  10    Period  Weeks    Status  New    Target Date  10/01/18      PT LONG TERM GOAL #3   Title  Patient will demonstrate functional recruitment of TA with breathing, sit-to-stand, squatting/lifting, and walking to allow for improved pelvic brace coordination, improved balance, and decreased downward pressure on the pelvic organs.    Time  10    Period  Weeks    Status  New    Target Date  10/01/18            Plan - 07/28/18 0949    Clinical Impression Statement  Further assessment revealed minor L posterior pelvic tilt and decreased stability on L as well ashyperlordosis/kyphosis and spasms surrounding the pelvis as well as decreased sacral and thoracic mobility which are causing increased tension on lumbar nerve roots. Pt. responded well to all interventions today, demonstrating understanding of and appropriate performance of all education and exercises discussed. Continue per POC.    Clinical Presentation  Stable    Clinical Decision Making  Moderate    Rehab Potential  Good    PT Frequency  1x / week    PT Duration  12 weeks    PT Treatment/Interventions  ADLs/Self  Care Home Management;Biofeedback;Electrical Stimulation;Functional mobility training;Therapeutic activities;Therapeutic exercise;Patient/family education;Neuromuscular re-education;Manual techniques;Scar mobilization;Taping;Dry needling;Spinal Manipulations;Joint Manipulations    PT Next Visit Plan  Perform DN suppounding L innominate followed by sacral mobility and MET correction to improve alignment. Educate on pre-squeeze and sneeze, review/discuss  soda can further.     PT Home Exercise Plan  seated posterior pelvic tilts and low-back stretch    Consulted and Agree with Plan of Care  Patient       Patient will benefit from skilled therapeutic intervention in order to improve the following deficits and impairments:  Increased fascial restricitons, Improper body mechanics, Pain, Decreased coordination, Decreased mobility, Decreased scar mobility, Increased muscle spasms, Postural dysfunction, Decreased endurance, Decreased strength, Decreased range of motion  Visit Diagnosis: Other muscle spasm  Other lack of coordination  Stress incontinence of urine  Dyspareunia in female     Problem List Patient Active Problem List   Diagnosis Date Noted  . HSV 1 IgG positive 06/24/2018  . HSV-2 IgG positive 06/24/2018  . Menopause 06/23/2018  . Status post vaginal hysterectomy 06/23/2018  . STD exposure 06/23/2018   Willa Rough DPT, ATC Willa Rough 07/28/2018, 9:55 AM  [place] MAIN [facility] SERVICES [place], [place], [address] Phone: [phone]   Fax:  [phone]  Name: Gwendolyn Holmes MRN: [id number] Date of Birth: 1951-02-27

## 2018-07-27 NOTE — Patient Instructions (Addendum)
  When seated, you want to maintain pelvic neutral with the shoulders gently down and back and ears in line with your shoulders. A lumbar roll such as the one below or a home-made towel-roll can be used for this purpose. Even Olympic athletes can only maintain proper seated posture for about 10 minutes without support!    Pictured: The Original [employer] Lumbar Roll     Do 2 sets of 15 tilts per day. Breathe in when you tilt forward (A) and out when you tuck under (B).    Take 5 deep breaths and repeat 2-3 times, once per day.       *Look into dry needling for pain relief. Check out shoe inserts for plantar fasciitis and toe separator for L foot if needed.

## 2018-08-03 ENCOUNTER — Ambulatory Visit: Payer: PPO

## 2018-08-03 DIAGNOSIS — M62838 Other muscle spasm: Secondary | ICD-10-CM

## 2018-08-03 DIAGNOSIS — N393 Stress incontinence (female) (male): Secondary | ICD-10-CM

## 2018-08-03 DIAGNOSIS — R278 Other lack of coordination: Secondary | ICD-10-CM

## 2018-08-03 DIAGNOSIS — N941 Unspecified dyspareunia: Secondary | ICD-10-CM

## 2018-08-03 NOTE — Patient Instructions (Signed)
Flexors, Lunge  Hip Flexor Stretch: Proposal Pose    Maintain pelvic tuck under, lift pubic bone toward navel. Engage posterior hip muscles (firm glute muscles of leg in back position) and shift forward until you feel stretch on front of leg that is down. To increase stretch, maintain balance and ease hips forward. You may use one hand on a chair for balance if needed. Hold for __5__ breaths. Repeat __2-3__ times each leg.  Do _1-2__ times per day.   

## 2018-08-03 NOTE — Therapy (Signed)
[place] MAIN [facility] SERVICES [address] [place], [place], [address] Phone: [phone]   Fax:  [phone]  Physical Therapy Treatment  Patient Details  Name: Gwendolyn Holmes MRN: [id number] Date of Birth: 1951/05/12 Referring Provider: Hassell Done Defrancesco   Encounter Date: 08/03/2018  PT End of Session - 08/04/18 1245    Visit Number  3    Number of Visits  10    Date for PT Re-Evaluation  09/29/18    Authorization Type  Medicare    PT Start Time  1600    PT Stop Time  1705    PT Time Calculation (min)  65 min    Activity Tolerance  Patient tolerated treatment well    Behavior During Therapy  [facility] for tasks assessed/performed       Past Medical History:  Diagnosis Date  . Environmental allergies   . Hypothyroidism     Past Surgical History:  Procedure Laterality Date  . APPENDECTOMY    . AUGMENTATION MAMMAPLASTY Bilateral 1985  . OTHER SURGICAL HISTORY     vocal cord   . VAGINAL HYSTERECTOMY     LSH    There were no vitals filed for this visit.   Pelvic Floor Physical Therapy Treatment Note  SCREENING  Changes in medications, allergies, or medical history?: no    SUBJECTIVE  Patient reports: She is doing alright, no big change yet but is doing her exercises.   Pain update:  Location of pain: LBP and Hips Current pain:  4/10  Max pain:  9/10 Least pain:  0/10 Nature of pain: Ache  Patient Goals: To not have to wear a pad at all.   OBJECTIVE  Changes in: Posture/Observations:  Anterior pelvic tilt  Range of Motion/Flexibilty:  Decreased Hip flexor EXT ROM  Palpation: TTP B to Iliacus, Psoas, and Pectineus  INTERVENTIONS THIS SESSION: Manual: Performed STM and TP release to L Psoas, Iliacus, and Pectineus and R Iliacus to decrease spasm and pain and allow for improved posture, decreased pressure on pelvic nerves, and improved PFM function. Dry needle: Performed standard approach TPDN with a  .30x44m needle to L Psoas, Iliacus, and Pectineus and R Iliacus to decrease spasm and pain and allow for improved posture, decreased pressure on pelvic nerves, and improved PFM function. Therex: educated on and Practiced hip-flexor stretch to decrease tension pulling into anterior pelvic tilt and prevent return of hip-flexor spasms for decreased pain and improved function.   Total time: 65 min.                    Trigger Point Dry Needling - 08/04/18 1239    Consent Given?  Yes    Education Handout Provided  No    Muscles Treated Upper Body  Iliopsoas   Pectineus on L and Iliacus only on R          PT Education - 08/04/18 1244    Education Details  See Pt. Instructions and Interventions this session.    Person(s) Educated  Patient    Methods  Explanation    Comprehension  Verbalized understanding       PT Short Term Goals - 07/22/18 1317      PT SHORT TERM GOAL #1   Title  Patient will demonstrate a coordinated contraction, relaxation, and bulge of the pelvic floor muscles to demonstrate functional recruitment and motion and allow for further strengthening.    Baseline  Pt. has difficulty achieving "bulge" due to  spasm    Time  6    Period  Weeks    Status  New    Target Date  09/02/18      PT SHORT TERM GOAL #2   Title  Patient will demonstrate functional recruitment of TA with breathing, sit-to-stand, squatting/lifting, and walking to allow for improved pelvic brace coordination, improved balance, and decreased downward pressure on the pelvic organs.    Time  6    Period  Weeks    Status  New    Target Date  09/02/18      PT SHORT TERM GOAL #3   Title  Patient will report consistent use of foot-stool (squatty-potty) for positioning with BM to decrease pressure on pelvic organs with BM.    Baseline  Prolapse visible with increased pressure.    Time  6    Period  Weeks    Status  New    Target Date  09/02/18        PT Long Term Goals - 07/23/18  0645      PT LONG TERM GOAL #1   Title  Patient will report no episodes of SUI over the course of the prior two weeks to demonstrate improved functional ability.    Baseline  Using thin pad with every-day activity and #4 pad with walking 2.4 miles    Time  10    Period  Weeks    Status  New    Target Date  10/01/18      PT LONG TERM GOAL #2   Title  Patient will report no pain with intercourse to demonstrate improved functional ability.    Baseline  Intercourse is painful enough to require stopping.    Time  10    Period  Weeks    Status  New    Target Date  10/01/18      PT LONG TERM GOAL #3   Title  Patient will demonstrate functional recruitment of TA with breathing, sit-to-stand, squatting/lifting, and walking to allow for improved pelvic brace coordination, improved balance, and decreased downward pressure on the pelvic organs.    Time  10    Period  Weeks    Status  New    Target Date  10/01/18            Plan - 08/04/18 1245    Clinical Impression Statement  Pt. responded slowly but well to all interventions today, demonstrating decreased pain and spasm and improved hip EXT ROM following treatment as well as understanding about all education provided. Continue per POC.    Clinical Presentation  Stable    Clinical Decision Making  Moderate    Rehab Potential  Good    PT Frequency  1x / week    PT Duration  12 weeks    PT Treatment/Interventions  ADLs/Self Care Home Management;Biofeedback;Electrical Stimulation;Functional mobility training;Therapeutic activities;Therapeutic exercise;Patient/family education;Neuromuscular re-education;Manual techniques;Scar mobilization;Taping;Dry needling;Spinal Manipulations;Joint Manipulations    PT Next Visit Plan  Perform DN suppounding R innominate (maybe L Pectineus) followed by sacral mobility and MET correction to improve alignment. Educate on pre-squeeze and sneeze, review/discuss soda can further.     PT Home Exercise Plan   seated posterior pelvic tilts and low-back stretch, hip-flexor stretch    Consulted and Agree with Plan of Care  Patient       Patient will benefit from skilled therapeutic intervention in order to improve the following deficits and impairments:  Increased fascial restricitons, Improper body mechanics, Pain, Decreased  coordination, Decreased mobility, Decreased scar mobility, Increased muscle spasms, Postural dysfunction, Decreased endurance, Decreased strength, Decreased range of motion  Visit Diagnosis: Other muscle spasm  Other lack of coordination  Stress incontinence of urine  Dyspareunia in female     Problem List Patient Active Problem List   Diagnosis Date Noted  . HSV 1 IgG positive 06/24/2018  . HSV-2 IgG positive 06/24/2018  . Menopause 06/23/2018  . Status post vaginal hysterectomy 06/23/2018  . STD exposure 06/23/2018   Willa Rough DPT, ATC Willa Rough 08/04/2018, 12:49 PM  [place] MAIN [facility] SERVICES [address] [place], [place], [address] Phone: [phone]   Fax:  [phone]  Name: Gwendolyn Holmes MRN: [id number] Date of Birth: [birth year]

## 2018-08-10 ENCOUNTER — Ambulatory Visit: Payer: PPO | Attending: Obstetrics and Gynecology

## 2018-08-10 DIAGNOSIS — N393 Stress incontinence (female) (male): Secondary | ICD-10-CM | POA: Insufficient documentation

## 2018-08-10 DIAGNOSIS — N941 Unspecified dyspareunia: Secondary | ICD-10-CM

## 2018-08-10 DIAGNOSIS — R278 Other lack of coordination: Secondary | ICD-10-CM | POA: Insufficient documentation

## 2018-08-10 DIAGNOSIS — M62838 Other muscle spasm: Secondary | ICD-10-CM | POA: Diagnosis not present

## 2018-08-10 NOTE — Therapy (Signed)
[facility] MAIN [facility] SERVICES [address] [place], [place], [address] Phone: [phone]   Fax:  [phone]  Physical Therapy Treatment  Patient Details  Name: Gwendolyn Holmes MRN: [id number] Date of Birth: [birth year] Referring Provider (PT): Sharon Seller   Encounter Date: 08/10/2018  PT End of Session - 08/13/18 0833    Visit Number  4    Number of Visits  10    Date for PT Re-Evaluation  09/29/18    Authorization Type  Medicare    PT Start Time  1500    PT Stop Time  1600    PT Time Calculation (min)  60 min    Activity Tolerance  Patient tolerated treatment well    Behavior During Therapy  [facility] for tasks assessed/performed       Past Medical History:  Diagnosis Date  . Environmental allergies   . Hypothyroidism     Past Surgical History:  Procedure Laterality Date  . APPENDECTOMY    . AUGMENTATION MAMMAPLASTY Bilateral 1985  . OTHER SURGICAL HISTORY     vocal cord   . VAGINAL HYSTERECTOMY     LSH    There were no vitals filed for this visit.    Pelvic Floor Physical Therapy Treatment Note  SCREENING  Changes in medications, allergies, or medical history?: no    SUBJECTIVE  Patient reports: Her elbows have been feeling so much better since we did dry needling (1/10). L hip feels good, R hip is still giving her pain. When going for her walks, she is not getting as "wet as she was" it is very dry compared to what it was. At home she is able to not wear a panty liner at home and not having "mist" in her underwear.    Pain update:  Location of pain: R>L  Current pain:  3/10  Max pain:  3/10 Least pain:  3/10 Nature of pain: ache  Patient Goals: To not have to wear a pad at all.   OBJECTIVE  Changes in: Posture/Observations:  Slight anterior pelvic tilt   Range of Motion/Flexibilty:  Decreased lumbar flexion, Improved following treatment.  Palpation: TTP to L>R Lumbar paraspinals and L>R QL and  glute min.  INTERVENTIONS THIS SESSION: Manual: Performed STM and TP release to Lumbar paraspinals and QL and glute min. to decrease spasms, pain, and to allow for decreased tension on nerve roots to allow for improved PFM function and decreased incontinence. Dry needle: Performed TPDN with standard approach and a .30x69mm needle to to Lumbar paraspinals and QL and glute min. to decrease spasms, pain, and to allow for decreased tension on nerve roots to allow for improved PFM function and decreased incontinence. Therex: educated on and practiced Side-stretch and mini-marches to maintain balance of muscles surrounding the pelvis and strengthen deep-core to protect against return of spasm.   Total time: 60 min.                           PT Education - 08/13/18 1324    Education Details  See Pt. Instructions and Interventions this session    Person(s) Educated  Patient    Methods  Explanation;Handout;Demonstration;Verbal cues    Comprehension  Verbalized understanding;Returned demonstration;Verbal cues required       PT Short Term Goals - 07/22/18 1317      PT SHORT TERM GOAL #1   Title  Patient will demonstrate a coordinated contraction, relaxation, and  bulge of the pelvic floor muscles to demonstrate functional recruitment and motion and allow for further strengthening.    Baseline  Pt. has difficulty achieving "bulge" due to spasm    Time  6    Period  Weeks    Status  New    Target Date  09/02/18      PT SHORT TERM GOAL #2   Title  Patient will demonstrate functional recruitment of TA with breathing, sit-to-stand, squatting/lifting, and walking to allow for improved pelvic brace coordination, improved balance, and decreased downward pressure on the pelvic organs.    Time  6    Period  Weeks    Status  New    Target Date  09/02/18      PT SHORT TERM GOAL #3   Title  Patient will report consistent use of foot-stool (squatty-potty) for positioning with BM to  decrease pressure on pelvic organs with BM.    Baseline  Prolapse visible with increased pressure.    Time  6    Period  Weeks    Status  New    Target Date  09/02/18        PT Long Term Goals - 07/23/18 0645      PT LONG TERM GOAL #1   Title  Patient will report no episodes of SUI over the course of the prior two weeks to demonstrate improved functional ability.    Baseline  Using thin pad with every-day activity and #4 pad with walking 2.4 miles    Time  10    Period  Weeks    Status  New    Target Date  10/01/18      PT LONG TERM GOAL #2   Title  Patient will report no pain with intercourse to demonstrate improved functional ability.    Baseline  Intercourse is painful enough to require stopping.    Time  10    Period  Weeks    Status  New    Target Date  10/01/18      PT LONG TERM GOAL #3   Title  Patient will demonstrate functional recruitment of TA with breathing, sit-to-stand, squatting/lifting, and walking to allow for improved pelvic brace coordination, improved balance, and decreased downward pressure on the pelvic organs.    Time  10    Period  Weeks    Status  New    Target Date  10/01/18            Plan - 08/13/18 1610    Clinical Impression Statement  Pt. responded well to all interventions and has shown considerable between-session improvement in pain, including chronic elbow pain that improved apparently from afferent stimulation of more distal structures through dry needling. She demonstrated decreased pain and spasm following today's session and demonstrated understanding of all education provided. Continue per POC.    Clinical Presentation  Stable    Clinical Decision Making  Moderate    Rehab Potential  Good    PT Frequency  1x / week    PT Duration  12 weeks    PT Treatment/Interventions  ADLs/Self Care Home Management;Biofeedback;Electrical Stimulation;Functional mobility training;Therapeutic activities;Therapeutic exercise;Patient/family  education;Neuromuscular re-education;Manual techniques;Scar mobilization;Taping;Dry needling;Spinal Manipulations;Joint Manipulations    PT Next Visit Plan  re-assess pelvic alignment and neck/shoulders. address appropriately.     PT Home Exercise Plan  seated posterior pelvic tilts and low-back stretch, hip-flexor stretch, side-stretch and mini marches.    Consulted and Agree with Plan of Care  Patient  Patient will benefit from skilled therapeutic intervention in order to improve the following deficits and impairments:  Increased fascial restricitons, Improper body mechanics, Pain, Decreased coordination, Decreased mobility, Decreased scar mobility, Increased muscle spasms, Postural dysfunction, Decreased endurance, Decreased strength, Decreased range of motion  Visit Diagnosis: Other muscle spasm  Other lack of coordination  Stress incontinence of urine  Dyspareunia in female     Problem List Patient Active Problem List   Diagnosis Date Noted  . HSV 1 IgG positive 06/24/2018  . HSV-2 IgG positive 06/24/2018  . Menopause 06/23/2018  . Status post vaginal hysterectomy 06/23/2018  . STD exposure 06/23/2018   Cleophus Molt DPT, ATC Cleophus Molt 08/13/2018, 8:38 AM  [facility] MAIN [facility] SERVICES [address] [place], [place], [address] Phone: [phone]   Fax:  [phone]  Name: Gwendolyn Holmes MRN: [id number] Date of Birth: Dec 21, 1950

## 2018-08-10 NOTE — Patient Instructions (Signed)
Mini-Marches    Exhale, drawing the lower tummy (TA) in toward the back bone and hold contraction while you lift one foot ~ 2 inches off the mat, then the other foot before relaxing and resetting. Try to keep your hips from rocking, using your hands to sense whether they are staying even as pictured.      Perform _10__ repetitions for _3__ sets. Do this _1-2_ times per day.    

## 2018-08-17 ENCOUNTER — Ambulatory Visit: Payer: PPO

## 2018-08-17 DIAGNOSIS — M62838 Other muscle spasm: Secondary | ICD-10-CM

## 2018-08-17 DIAGNOSIS — N393 Stress incontinence (female) (male): Secondary | ICD-10-CM

## 2018-08-17 DIAGNOSIS — N941 Unspecified dyspareunia: Secondary | ICD-10-CM

## 2018-08-17 DIAGNOSIS — R278 Other lack of coordination: Secondary | ICD-10-CM

## 2018-08-17 NOTE — Therapy (Signed)
[facility] MAIN [facility] SERVICES [address] [place], [place], [address] Phone: [phone]   Fax:  [phone]  Physical Therapy Treatment  Patient Details  Name: Gwendolyn Holmes MRN: [id number] Date of Birth: 07/14/1951 Referring Provider (PT): Sharon Seller   Encounter Date: 08/17/2018  PT End of Session - 08/17/18 1646    Visit Number  5    Number of Visits  10    Date for PT Re-Evaluation  09/29/18    Authorization Type  Medicare    PT Start Time  1505    PT Stop Time  1630    PT Time Calculation (min)  85 min    Activity Tolerance  Patient tolerated treatment well    Behavior During Therapy  [facility] for tasks assessed/performed       Past Medical History:  Diagnosis Date  . Environmental allergies   . Hypothyroidism     Past Surgical History:  Procedure Laterality Date  . APPENDECTOMY    . AUGMENTATION MAMMAPLASTY Bilateral 1985  . OTHER SURGICAL HISTORY     vocal cord   . VAGINAL HYSTERECTOMY     LSH    There were no vitals filed for this visit.   Pelvic Floor Physical Therapy Treatment Note  SCREENING  Changes in medications, allergies, or medical history?: no    SUBJECTIVE  Patient reports: This week has not been as good. She had a couple days with more leakage during her walks, including wetting through to her pants today on her walk. She has had throbbing from her low back into her R leg over the past week and has not slept well until Sunday when it let up some and now mostly having pain in R LE and glute, knee.   Precautions:  Fibromyalgia  Pain update:  Location of pain: R low back, hip, LE Current pain:  2/10  Max pain:  5/10 Least pain:  2/10 Nature of pain: throbbing/achy  Patient Goals: To not have to wear a pad at all.   OBJECTIVE  Changes in:  Range of Motion/Flexibilty:  Decreased sacral mobility B with greater discomfort on L. Very little fascial mobility through region around L  sacral border. No fascial mobility through ~L3-5 region in cranial direction. Improved following treatment.   Palpation: TTP through B QL, Glute min, L deep external rotators and through B lumbar multifidus.  INTERVENTIONS THIS SESSION: Manual: Performed STM and TP release to B multifidus to decreased pressure on lumbar nerve roots and allow for improved neural flow to the PFM and bladder for improved control.  Performed PA mobs to L sacral border improve mobility and decrease tension on sacral nerves to decrease pain and improve bladder function. Performed MFR to lower lumbar region and L sacral border to decrease myofascial restriction and allow for improved movement quality to allow for continued pain relief. Dry-needle: Performed TPDN with standard approach and a .30x63mm needle to B multifidus to decreased pressure on lumbar nerve roots and allow for improved neural flow to the PFM and bladder for improved control.   Therex: reviewed side-stretch to ensure proper performance due to Pt. Uncertainty to allow for greatest results of lengthened muscles and decreased pain.  Self-care: educated on pain science and how she will experience more exaggerated hills and valleys due to fibromyalgia as well as educated on how pelvic floor spasms can lead to inability to orgasm.  Total time: 85 min.  PT Education - 08/17/18 1645    Education Details  See Interventions this session. Pt. also educated on rationale behind short-term increase in symptoms and what to expect moving forward.    Person(s) Educated  Patient    Methods  Explanation;Demonstration;Verbal cues    Comprehension  Verbalized understanding;Returned demonstration;Verbal cues required       PT Short Term Goals - 07/22/18 1317      PT SHORT TERM GOAL #1   Title  Patient will demonstrate a coordinated contraction, relaxation, and bulge of the pelvic floor muscles to demonstrate functional  recruitment and motion and allow for further strengthening.    Baseline  Pt. has difficulty achieving "bulge" due to spasm    Time  6    Period  Weeks    Status  New    Target Date  09/02/18      PT SHORT TERM GOAL #2   Title  Patient will demonstrate functional recruitment of TA with breathing, sit-to-stand, squatting/lifting, and walking to allow for improved pelvic brace coordination, improved balance, and decreased downward pressure on the pelvic organs.    Time  6    Period  Weeks    Status  New    Target Date  09/02/18      PT SHORT TERM GOAL #3   Title  Patient will report consistent use of foot-stool (squatty-potty) for positioning with BM to decrease pressure on pelvic organs with BM.    Baseline  Prolapse visible with increased pressure.    Time  6    Period  Weeks    Status  New    Target Date  09/02/18        PT Long Term Goals - 07/23/18 0645      PT LONG TERM GOAL #1   Title  Patient will report no episodes of SUI over the course of the prior two weeks to demonstrate improved functional ability.    Baseline  Using thin pad with every-day activity and #4 pad with walking 2.4 miles    Time  10    Period  Weeks    Status  New    Target Date  10/01/18      PT LONG TERM GOAL #2   Title  Patient will report no pain with intercourse to demonstrate improved functional ability.    Baseline  Intercourse is painful enough to require stopping.    Time  10    Period  Weeks    Status  New    Target Date  10/01/18      PT LONG TERM GOAL #3   Title  Patient will demonstrate functional recruitment of TA with breathing, sit-to-stand, squatting/lifting, and walking to allow for improved pelvic brace coordination, improved balance, and decreased downward pressure on the pelvic organs.    Time  10    Period  Weeks    Status  New    Target Date  10/01/18            Plan - 08/17/18 1647    Clinical Impression Statement  Pt. had a heightened reaction to last-weeks  treatment, likely due to her fibromyalgia and had a set-back in her bladder symptoms as well. Pt. responded well to education on how the body responds to treatment and the likely hills and valleys she will experience on the way to resolution of her symptoms. She responded well to all interventions today, demonstrating decreased hip pain with just a little achyness from the  treatment following manual and dry needling. She will continue to benefit from skilled PT. Continue per POC.    Clinical Presentation  Stable    Clinical Decision Making  Moderate    Rehab Potential  Good    PT Frequency  1x / week    PT Duration  12 weeks    PT Treatment/Interventions  ADLs/Self Care Home Management;Biofeedback;Electrical Stimulation;Functional mobility training;Therapeutic activities;Therapeutic exercise;Patient/family education;Neuromuscular re-education;Manual techniques;Scar mobilization;Taping;Dry needling;Spinal Manipulations;Joint Manipulations    PT Next Visit Plan  re-assess pelvic alignment and neck/shoulders. check adductors and Glute Min/ QL again, assess head/neck and address appropriately.     PT Home Exercise Plan  seated posterior pelvic tilts and low-back stretch, hip-flexor stretch, side-stretch and mini marches.    Consulted and Agree with Plan of Care  Patient       Patient will benefit from skilled therapeutic intervention in order to improve the following deficits and impairments:  Increased fascial restricitons, Improper body mechanics, Pain, Decreased coordination, Decreased mobility, Decreased scar mobility, Increased muscle spasms, Postural dysfunction, Decreased endurance, Decreased strength, Decreased range of motion  Visit Diagnosis: Other muscle spasm  Other lack of coordination  Stress incontinence of urine  Dyspareunia in female     Problem List Patient Active Problem List   Diagnosis Date Noted  . HSV 1 IgG positive 06/24/2018  . HSV-2 IgG positive 06/24/2018  .  Menopause 06/23/2018  . Status post vaginal hysterectomy 06/23/2018  . STD exposure 06/23/2018   Cleophus Molt DPT, ATC Cleophus Molt 08/17/2018, 4:53 PM  [place] [facility] MAIN [facility] SERVICES [address] [place], [place], [address] Phone: [phone]   Fax:  [phone]  Name: Gwendolyn Holmes MRN: [id number] Date of Birth: December 03, 1950

## 2018-08-24 ENCOUNTER — Ambulatory Visit: Payer: PPO

## 2018-08-24 DIAGNOSIS — R278 Other lack of coordination: Secondary | ICD-10-CM

## 2018-08-24 DIAGNOSIS — M62838 Other muscle spasm: Secondary | ICD-10-CM | POA: Diagnosis not present

## 2018-08-24 DIAGNOSIS — N941 Unspecified dyspareunia: Secondary | ICD-10-CM

## 2018-08-24 DIAGNOSIS — N393 Stress incontinence (female) (male): Secondary | ICD-10-CM

## 2018-08-24 NOTE — Therapy (Signed)
[facility] MAIN [facility] SERVICES [address] [place], [place], [address] Phone: [phone]   Fax:  [phone]  Physical Therapy Treatment  Patient Details  Name: Gwendolyn Holmes MRN: [id number] Date of Birth: 1951/02/27 Referring Provider (PT): Sharon Seller   Encounter Date: 08/24/2018  PT End of Session - 08/25/18 1742    Visit Number  6    Number of Visits  10    Date for PT Re-Evaluation  09/29/18    Authorization Type  Medicare    PT Start Time  1505    PT Stop Time  1605    PT Time Calculation (min)  60 min    Activity Tolerance  Patient tolerated treatment well    Behavior During Therapy  [facility] for tasks assessed/performed       Past Medical History:  Diagnosis Date  . Environmental allergies   . Hypothyroidism     Past Surgical History:  Procedure Laterality Date  . APPENDECTOMY    . AUGMENTATION MAMMAPLASTY Bilateral 1985  . OTHER SURGICAL HISTORY     vocal cord   . VAGINAL HYSTERECTOMY     LSH    There were no vitals filed for this visit.   Pelvic Floor Physical Therapy Treatment Note  SCREENING  Changes in medications, allergies, or medical history?: no   SUBJECTIVE  Patient reports: Elbows still feeling much better, feels that her neck has loosened up some. She can rotate more freely without having tightness and spasm. R hip is feeling somewhat better. Still not completely better.   Pain update:  Location of pain: Elbows, neck, R low back Current pain:  1/10  Max pain:  2/10 Least pain:  1/10 Nature of pain: achy/tigtness  Patient Goals: To not have to wear a pad at all.   OBJECTIVE  Changes in: Posture/Observations:  Patient is demonstrating improved upper posture but tends to over-correct slightly with anterior pelvic tilt.  Palpation: TTP to R lumbar paraspinals and QL   INTERVENTIONS THIS SESSION: Therex: Educated on and practiced posterior pelvic tilts in supine with pillow  elevating hips to decrease efects of prolapse and strengthen PFM in supportive position to decrease SUI symptoms. Self-care: Educated Pt. On soda-can theory as well as sit-to-stand mechanics and squatting body mechanics for decreasing intra-abdominal pressure on the PFM  to decrease prolapse and SUI. Educated on the rationale behind why she is experiencing different areas of soreness than previously and on how the squatty potty can be used to improve ease of BM and prevent worsening of Prolapse.  Manual: Performed TP release to R Lumbar paraspinals and QL to decrease pain and spasm.  Total time: 60 min.                           PT Education - 08/25/18 1741    Education Details  See Pt. Instructions and interventions this session    Person(s) Educated  Patient    Methods  Explanation;Demonstration;Tactile cues;Verbal cues;Handout    Comprehension  Verbalized understanding;Returned demonstration;Verbal cues required;Tactile cues required       PT Short Term Goals - 07/22/18 1317      PT SHORT TERM GOAL #1   Title  Patient will demonstrate a coordinated contraction, relaxation, and bulge of the pelvic floor muscles to demonstrate functional recruitment and motion and allow for further strengthening.    Baseline  Pt. has difficulty achieving "bulge" due to spasm  Time  6    Period  Weeks    Status  New    Target Date  09/02/18      PT SHORT TERM GOAL #2   Title  Patient will demonstrate functional recruitment of TA with breathing, sit-to-stand, squatting/lifting, and walking to allow for improved pelvic brace coordination, improved balance, and decreased downward pressure on the pelvic organs.    Time  6    Period  Weeks    Status  New    Target Date  09/02/18      PT SHORT TERM GOAL #3   Title  Patient will report consistent use of foot-stool (squatty-potty) for positioning with BM to decrease pressure on pelvic organs with BM.    Baseline  Prolapse visible  with increased pressure.    Time  6    Period  Weeks    Status  New    Target Date  09/02/18        PT Long Term Goals - 07/23/18 0645      PT LONG TERM GOAL #1   Title  Patient will report no episodes of SUI over the course of the prior two weeks to demonstrate improved functional ability.    Baseline  Using thin pad with every-day activity and #4 pad with walking 2.4 miles    Time  10    Period  Weeks    Status  New    Target Date  10/01/18      PT LONG TERM GOAL #2   Title  Patient will report no pain with intercourse to demonstrate improved functional ability.    Baseline  Intercourse is painful enough to require stopping.    Time  10    Period  Weeks    Status  New    Target Date  10/01/18      PT LONG TERM GOAL #3   Title  Patient will demonstrate functional recruitment of TA with breathing, sit-to-stand, squatting/lifting, and walking to allow for improved pelvic brace coordination, improved balance, and decreased downward pressure on the pelvic organs.    Time  10    Period  Weeks    Status  New    Target Date  10/01/18            Plan - 08/25/18 1742    Clinical Impression Statement  Pt. is demonstrating decreased pain with slight pain in R low back and hip but has had a return of SUI with walking due to increase in gardening activities and increased pressure on the prolapse. Continue per POC.    Clinical Presentation  Stable    Clinical Decision Making  Moderate    Rehab Potential  Good    PT Frequency  1x / week    PT Duration  12 weeks    PT Treatment/Interventions  ADLs/Self Care Home Management;Biofeedback;Electrical Stimulation;Functional mobility training;Therapeutic activities;Therapeutic exercise;Patient/family education;Neuromuscular re-education;Manual techniques;Scar mobilization;Taping;Dry needling;Spinal Manipulations;Joint Manipulations    PT Next Visit Plan  re-assess pelvic alignment and neck/shoulders. check adductors and Glute Min/ QL  again, assess head/neck and address appropriately.     PT Home Exercise Plan  seated posterior pelvic tilts and low-back stretch, hip-flexor stretch, side-stretch and mini marches, posterior pelvic tilts in supine with kegels.    Consulted and Agree with Plan of Care  Patient       Patient will benefit from skilled therapeutic intervention in order to improve the following deficits and impairments:  Increased fascial restricitons, Improper body  mechanics, Pain, Decreased coordination, Decreased mobility, Decreased scar mobility, Increased muscle spasms, Postural dysfunction, Decreased endurance, Decreased strength, Decreased range of motion  Visit Diagnosis: Other muscle spasm  Other lack of coordination  Stress incontinence of urine  Dyspareunia in female     Problem List Patient Active Problem List   Diagnosis Date Noted  . HSV 1 IgG positive 06/24/2018  . HSV-2 IgG positive 06/24/2018  . Menopause 06/23/2018  . Status post vaginal hysterectomy 06/23/2018  . STD exposure 06/23/2018   Cleophus Molt DPT, ATC Cleophus Molt 08/25/2018, 5:49 PM  [place] [facility] MAIN [facility] SERVICES [address] [place], [place], [address] Phone: [phone]   Fax:  [phone]  Name: Gwendolyn Holmes MRN: [id number] Date of Birth: August 09, 1951

## 2018-08-24 NOTE — Patient Instructions (Addendum)
   EXhale on Exertion! To protect the pelvic floor from extra pressure when you push, pull, bend or lift.     Sit, feet flat, scoot forward to the edge of the chair. Inhale as you bend forward at hips, begin to exhale just before and while you stand, contracting the glutes, lower tummy muscles and pelvic floor as if stopping urination as you stand up.   * Do this every time you sit or stand! If you catch yourself doing it "wrong, re-set and do it again so it can become habit!        Pelvic Tilt With Pelvic Floor (Hook-Lying)        Lie with hips and knees bent. Squeeze pelvic floor and flatten low back while breathing out so that pelvis tilts. Repeat _2x10__ times. Do _1-2__ times a day.  Put a pillow under your hips in this position when doing this exercise to help decrease pressure in the pelvis when it feels "heavy" or if you are noticing more leakage.

## 2018-08-31 ENCOUNTER — Ambulatory Visit: Payer: PPO

## 2018-08-31 DIAGNOSIS — R278 Other lack of coordination: Secondary | ICD-10-CM

## 2018-08-31 DIAGNOSIS — M62838 Other muscle spasm: Secondary | ICD-10-CM | POA: Diagnosis not present

## 2018-08-31 DIAGNOSIS — N393 Stress incontinence (female) (male): Secondary | ICD-10-CM

## 2018-08-31 DIAGNOSIS — N941 Unspecified dyspareunia: Secondary | ICD-10-CM

## 2018-08-31 NOTE — Therapy (Signed)
[place] [facility] MAIN [facility] SERVICES [address] [place], [place], [address] Phone: [phone]   Fax:  [phone]  Physical Therapy Treatment  Patient Details  Name: Gwendolyn Holmes MRN: [id number] Date of Birth: 06-05-1951 Referring Provider (PT): Sharon Seller   Encounter Date: 08/31/2018  PT End of Session - 09/01/18 1523    Visit Number  7    Number of Visits  10    Date for PT Re-Evaluation  09/29/18    Authorization Type  Medicare    PT Start Time  1500    PT Stop Time  1600    PT Time Calculation (min)  60 min    Activity Tolerance  Patient tolerated treatment well    Behavior During Therapy  [facility] for tasks assessed/performed       Past Medical History:  Diagnosis Date  . Environmental allergies   . Hypothyroidism     Past Surgical History:  Procedure Laterality Date  . APPENDECTOMY    . AUGMENTATION MAMMAPLASTY Bilateral 1985  . OTHER SURGICAL HISTORY     vocal cord   . VAGINAL HYSTERECTOMY     LSH    There were no vitals filed for this visit.    Pelvic Floor Physical Therapy Treatment Note  SCREENING  Changes in medications, allergies, or medical history?:  no   SUBJECTIVE  Patient reports: She has good and bad days with leakage on her walks now. Is not having leakage with sneezing or coughing now. Is not needing a pad around the house and is not having the "mist" throughout the day. Has had several days where she did not have as much. Pain is "decent" her hip is better.   Pain update:  Location of pain: neck and R lateral hip and anterior thigh Current pain:  2/10  Max pain:  2/10 Least pain:  1/10 Nature of pain: ache  Patient Goals: To not have to wear a pad at all.   OBJECTIVE  Changes in:  Range of Motion/Flexibilty:  Decreased hip ABD B.  Palpation: TTP to B adductors, especially Pectineus.   INTERVENTIONS THIS SESSION: Manual: performed TP release to B Pectineus and adductor brevis  to decrease pain in hip and knee.  Therex: educated on and practiced frog-stretch to continue to lengthen adductors for prevention of return of pain and spasm in the PFM and hips/back.   Total time: 60 min.                          PT Education - 09/01/18 1523    Education Details  See Pt. Instructions and Interventions this session.    Person(s) Educated  Patient    Methods  Explanation;Verbal cues;Handout    Comprehension  Verbalized understanding;Verbal cues required;Tactile cues required       PT Short Term Goals - 07/22/18 1317      PT SHORT TERM GOAL #1   Title  Patient will demonstrate a coordinated contraction, relaxation, and bulge of the pelvic floor muscles to demonstrate functional recruitment and motion and allow for further strengthening.    Baseline  Pt. has difficulty achieving "bulge" due to spasm    Time  6    Period  Weeks    Status  New    Target Date  09/02/18      PT SHORT TERM GOAL #2   Title  Patient will demonstrate functional recruitment of TA with breathing, sit-to-stand, squatting/lifting, and  walking to allow for improved pelvic brace coordination, improved balance, and decreased downward pressure on the pelvic organs.    Time  6    Period  Weeks    Status  New    Target Date  09/02/18      PT SHORT TERM GOAL #3   Title  Patient will report consistent use of foot-stool (squatty-potty) for positioning with BM to decrease pressure on pelvic organs with BM.    Baseline  Prolapse visible with increased pressure.    Time  6    Period  Weeks    Status  New    Target Date  09/02/18        PT Long Term Goals - 07/23/18 0645      PT LONG TERM GOAL #1   Title  Patient will report no episodes of SUI over the course of the prior two weeks to demonstrate improved functional ability.    Baseline  Using thin pad with every-day activity and #4 pad with walking 2.4 miles    Time  10    Period  Weeks    Status  New    Target Date   10/01/18      PT LONG TERM GOAL #2   Title  Patient will report no pain with intercourse to demonstrate improved functional ability.    Baseline  Intercourse is painful enough to require stopping.    Time  10    Period  Weeks    Status  New    Target Date  10/01/18      PT LONG TERM GOAL #3   Title  Patient will demonstrate functional recruitment of TA with breathing, sit-to-stand, squatting/lifting, and walking to allow for improved pelvic brace coordination, improved balance, and decreased downward pressure on the pelvic organs.    Time  10    Period  Weeks    Status  New    Target Date  10/01/18            Plan - 09/01/18 1523    Clinical Impression Statement  Pt. responded well to all interventions  today, demonstrating decreased pain in R hip/knee to 0/10 and spasm following treatment as well as understanding of all education provided.    Clinical Presentation  Stable    Clinical Decision Making  Moderate    Rehab Potential  Good    PT Frequency  1x / week    PT Duration  12 weeks    PT Treatment/Interventions  ADLs/Self Care Home Management;Biofeedback;Electrical Stimulation;Functional mobility training;Therapeutic activities;Therapeutic exercise;Patient/family education;Neuromuscular re-education;Manual techniques;Scar mobilization;Taping;Dry needling;Spinal Manipulations;Joint Manipulations    PT Next Visit Plan   assess head/neck and address appropriately.  re-assess pelvic alignment and neck/shoulders. re-check adductors (DN if needed) and Glute Min/ QL again,    PT Home Exercise Plan  seated posterior pelvic tilts and low-back stretch, hip-flexor stretch, side-stretch and mini marches, posterior pelvic tilts in supine with kegels, frog stretch.    Consulted and Agree with Plan of Care  Patient       Patient will benefit from skilled therapeutic intervention in order to improve the following deficits and impairments:  Increased fascial restricitons, Improper body  mechanics, Pain, Decreased coordination, Decreased mobility, Decreased scar mobility, Increased muscle spasms, Postural dysfunction, Decreased endurance, Decreased strength, Decreased range of motion  Visit Diagnosis: Other muscle spasm  Other lack of coordination  Stress incontinence of urine  Dyspareunia in female     Problem List Patient Active Problem List  Diagnosis Date Noted  . HSV 1 IgG positive 06/24/2018  . HSV-2 IgG positive 06/24/2018  . Menopause 06/23/2018  . Status post vaginal hysterectomy 06/23/2018  . STD exposure 06/23/2018   Cleophus Molt DPT, ATC Cleophus Molt 09/01/2018, 3:29 PM  [place] [facility] MAIN [facility] SERVICES [address] [place], [place], [address] Phone: [phone]   Fax:  [phone]  Name: Gwendolyn Holmes MRN: [id number] Date of Birth: 07-09-1951

## 2018-08-31 NOTE — Patient Instructions (Signed)
  Take 5 deep breaths, allowing gravity to gently pull you into a deeper stretch with each breath. Repeat 2-3 times, once a day.  

## 2018-09-07 ENCOUNTER — Ambulatory Visit: Payer: PPO

## 2018-09-07 DIAGNOSIS — M62838 Other muscle spasm: Secondary | ICD-10-CM

## 2018-09-07 DIAGNOSIS — N393 Stress incontinence (female) (male): Secondary | ICD-10-CM

## 2018-09-07 DIAGNOSIS — N941 Unspecified dyspareunia: Secondary | ICD-10-CM

## 2018-09-07 DIAGNOSIS — R278 Other lack of coordination: Secondary | ICD-10-CM

## 2018-09-07 NOTE — Patient Instructions (Signed)
   Take longer steps, using the glutes to propel you. Keep your feet ~2 inches apart and lean into hills as you go up hill, hinging at the waist to engage the deep core muscles.

## 2018-09-07 NOTE — Therapy (Signed)
[place] [facility] MAIN [facility] SERVICES [address] [place], [place], [address] Phone: [phone]   Fax:  [phone]  Physical Therapy Treatment  Patient Details  Name: Gwendolyn Holmes MRN: [id number] Date of Birth: 27-Feb-1951 Referring Provider (PT): Sharon Seller   Encounter Date: 09/07/2018    Past Medical History:  Diagnosis Date  . Environmental allergies   . Hypothyroidism     Past Surgical History:  Procedure Laterality Date  . APPENDECTOMY    . AUGMENTATION MAMMAPLASTY Bilateral 1985  . OTHER SURGICAL HISTORY     vocal cord   . VAGINAL HYSTERECTOMY     LSH    There were no vitals filed for this visit.   Pelvic Floor Physical Therapy Treatment Note  SCREENING  Changes in medications, allergies, or medical history?: no    SUBJECTIVE  Patient reports: Is not having a good time, had a lot of leakage on her walk yesterday. Is feeling like she is going backward. Doing exercises almost every day. Missed 3 days in a row over the weekend but had been consistent. Yesterday got back into everything but is not feeling good about it like she normally does. She is in a little slump  Pain update:  Location of pain: Elbows, neck, legs Current pain:  2/10  Max pain:  4/10 Least pain:  0/10 Nature of pain: achy  * none following treatment.  Patient Goals: To not have to wear a pad at all.   OBJECTIVE  Changes in: Posture/Observations:  Anterior pelvic tilt   Range of Motion/Flexibilty:  Decreased cervical spine mobility/painwith pressure  Palpation: TTP to cervical paraspinals and multifidus.  Gait Analysis: Short stride length, decreased thoracic mobility, arm pumping, decreased hip extension and narrow stance.  INTERVENTIONS THIS SESSION: NM re-ed: educated on and practiced walking mechanics to decrease spasms in low back and prevent pressure on nerves causing leakage with AMB. Manual: TP release to cervical  extensors and performed PA and lateral mobs to C2 and C3-5 to improve mobility and decrease pain and spasm for decreased neural sensitivity and pain.  Total time: 65 min.                             PT Short Term Goals - 07/22/18 1317      PT SHORT TERM GOAL #1   Title  Patient will demonstrate a coordinated contraction, relaxation, and bulge of the pelvic floor muscles to demonstrate functional recruitment and motion and allow for further strengthening.    Baseline  Pt. has difficulty achieving "bulge" due to spasm    Time  6    Period  Weeks    Status  New    Target Date  09/02/18      PT SHORT TERM GOAL #2   Title  Patient will demonstrate functional recruitment of TA with breathing, sit-to-stand, squatting/lifting, and walking to allow for improved pelvic brace coordination, improved balance, and decreased downward pressure on the pelvic organs.    Time  6    Period  Weeks    Status  New    Target Date  09/02/18      PT SHORT TERM GOAL #3   Title  Patient will report consistent use of foot-stool (squatty-potty) for positioning with BM to decrease pressure on pelvic organs with BM.    Baseline  Prolapse visible with increased pressure.    Time  6    Period  Weeks    Status  New    Target Date  09/02/18        PT Long Term Goals - 07/23/18 0645      PT LONG TERM GOAL #1   Title  Patient will report no episodes of SUI over the course of the prior two weeks to demonstrate improved functional ability.    Baseline  Using thin pad with every-day activity and #4 pad with walking 2.4 miles    Time  10    Period  Weeks    Status  New    Target Date  10/01/18      PT LONG TERM GOAL #2   Title  Patient will report no pain with intercourse to demonstrate improved functional ability.    Baseline  Intercourse is painful enough to require stopping.    Time  10    Period  Weeks    Status  New    Target Date  10/01/18      PT LONG TERM GOAL #3   Title   Patient will demonstrate functional recruitment of TA with breathing, sit-to-stand, squatting/lifting, and walking to allow for improved pelvic brace coordination, improved balance, and decreased downward pressure on the pelvic organs.    Time  10    Period  Weeks    Status  New    Target Date  10/01/18              Patient will benefit from skilled therapeutic intervention in order to improve the following deficits and impairments:     Visit Diagnosis: No diagnosis found.     Problem List Patient Active Problem List   Diagnosis Date Noted  . HSV 1 IgG positive 06/24/2018  . HSV-2 IgG positive 06/24/2018  . Menopause 06/23/2018  . Status post vaginal hysterectomy 06/23/2018  . STD exposure 06/23/2018   Cleophus Molt DPT, ATC Cleophus Molt 09/07/2018, 3:11 PM  [place] [facility] MAIN [facility] SERVICES [address] [place], [place], [address] Phone: [phone]   Fax:  [phone]  Name: Gwendolyn Holmes MRN: [id number] Date of Birth: 26-Nov-1950

## 2018-09-09 ENCOUNTER — Other Ambulatory Visit: Payer: Self-pay | Admitting: Obstetrics and Gynecology

## 2018-09-09 ENCOUNTER — Ambulatory Visit
Admission: RE | Admit: 2018-09-09 | Discharge: 2018-09-09 | Disposition: A | Discharge status: Home / Self Care | Payer: PPO | Source: Ambulatory Visit | Attending: Obstetrics and Gynecology | Admitting: Obstetrics and Gynecology

## 2018-09-09 DIAGNOSIS — Z1239 Encounter for other screening for malignant neoplasm of breast: Secondary | ICD-10-CM

## 2018-09-09 DIAGNOSIS — Z780 Asymptomatic menopausal state: Secondary | ICD-10-CM | POA: Insufficient documentation

## 2018-09-09 DIAGNOSIS — M85851 Other specified disorders of bone density and structure, right thigh: Secondary | ICD-10-CM | POA: Diagnosis not present

## 2018-09-09 DIAGNOSIS — Z1231 Encounter for screening mammogram for malignant neoplasm of breast: Secondary | ICD-10-CM | POA: Insufficient documentation

## 2018-09-09 DIAGNOSIS — Z13820 Encounter for screening for osteoporosis: Secondary | ICD-10-CM | POA: Insufficient documentation

## 2018-09-09 DIAGNOSIS — M85852 Other specified disorders of bone density and structure, left thigh: Secondary | ICD-10-CM | POA: Diagnosis not present

## 2018-09-14 ENCOUNTER — Ambulatory Visit: Payer: PPO | Attending: Obstetrics and Gynecology

## 2018-09-14 ENCOUNTER — Telehealth: Payer: Self-pay | Admitting: Obstetrics and Gynecology

## 2018-09-14 DIAGNOSIS — N941 Unspecified dyspareunia: Secondary | ICD-10-CM | POA: Insufficient documentation

## 2018-09-14 DIAGNOSIS — R278 Other lack of coordination: Secondary | ICD-10-CM | POA: Diagnosis not present

## 2018-09-14 DIAGNOSIS — N393 Stress incontinence (female) (male): Secondary | ICD-10-CM | POA: Insufficient documentation

## 2018-09-14 DIAGNOSIS — M62838 Other muscle spasm: Secondary | ICD-10-CM | POA: Diagnosis not present

## 2018-09-14 NOTE — Telephone Encounter (Signed)
lmtrc

## 2018-09-14 NOTE — Patient Instructions (Signed)
   Place foam roller or towel under your upper back between your shoulder blades. Support your head with your hands, elbows forward, and gently rock back and forth and side to side to improve motion in your back.

## 2018-09-14 NOTE — Therapy (Signed)
[place] [facility] MAIN [facility] SERVICES [address] [place], [place], [address] Phone: [phone]   Fax:  [phone]  Physical Therapy Treatment  Patient Details  Name: Gwendolyn Holmes MRN: [id number] Date of Birth: 26-Aug-1951 Referring Provider (PT): Sharon Seller   Encounter Date: 09/14/2018  PT End of Session - 09/15/18 0932    Visit Number  9    Number of Visits  10    Date for PT Re-Evaluation  09/29/18    Authorization Type  Medicare    PT Start Time  1500    PT Stop Time  1600    PT Time Calculation (min)  60 min    Activity Tolerance  Patient tolerated treatment well    Behavior During Therapy  [facility] for tasks assessed/performed       Past Medical History:  Diagnosis Date  . Environmental allergies   . Hypothyroidism     Past Surgical History:  Procedure Laterality Date  . APPENDECTOMY    . AUGMENTATION MAMMAPLASTY Bilateral 1985  . OTHER SURGICAL HISTORY     vocal cord   . VAGINAL HYSTERECTOMY     LSH    There were no vitals filed for this visit.    Pelvic Floor Physical Therapy Treatment Note  SCREENING  Changes in medications, allergies, or medical history?: no    SUBJECTIVE  Patient reports: She has been doing well Has not had a lot of leakage over the past two weeks. Today was a bit heavier but she was moving stuff yesterday etc. She did not have any leakage with all the bending and lifting.   Pain update:  Location of pain: B hips and elbows Current pain:  0/10  Max pain:  1/10 Least pain:  0/10 Nature of pain: achy/sore  Patient Goals: To not have to wear a pad at all.   OBJECTIVE  Changes in: Posture/Observations:  Mild hyperlordotic/kyphotic curves  Range of Motion/Flexibilty:  Decreased mobility at L>r sacral border and through thoracic spinous processes.  Palpation: TTP to L Lumbar paraspinals and QL.  INTERVENTIONS THIS SESSION: Manual: Performed TP release to L QL and lumbar  paraspinals to continue to improve balance of musculature and decrease spasm and pain for long-term relief and improved SUI symptoms. Therex: Educated on and practiced thoracic extensions over towel roll to continue to improve and maintain thoracic mobility for sustained relief of pain and SUI.  Total time: 60 min.                          PT Education - 09/15/18 0931    Education Details  See Pt. Instructions and Interventions this session    Person(s) Educated  Patient    Methods  Explanation;Verbal cues;Handout    Comprehension  Verbalized understanding;Returned demonstration;Verbal cues required       PT Short Term Goals - 07/22/18 1317      PT SHORT TERM GOAL #1   Title  Patient will demonstrate a coordinated contraction, relaxation, and bulge of the pelvic floor muscles to demonstrate functional recruitment and motion and allow for further strengthening.    Baseline  Pt. has difficulty achieving "bulge" due to spasm    Time  6    Period  Weeks    Status  New    Target Date  09/02/18      PT SHORT TERM GOAL #2   Title  Patient will demonstrate functional recruitment of TA with breathing,  sit-to-stand, squatting/lifting, and walking to allow for improved pelvic brace coordination, improved balance, and decreased downward pressure on the pelvic organs.    Time  6    Period  Weeks    Status  New    Target Date  09/02/18      PT SHORT TERM GOAL #3   Title  Patient will report consistent use of foot-stool (squatty-potty) for positioning with BM to decrease pressure on pelvic organs with BM.    Baseline  Prolapse visible with increased pressure.    Time  6    Period  Weeks    Status  New    Target Date  09/02/18        PT Long Term Goals - 07/23/18 0645      PT LONG TERM GOAL #1   Title  Patient will report no episodes of SUI over the course of the prior two weeks to demonstrate improved functional ability.    Baseline  Using thin pad with every-day  activity and #4 pad with walking 2.4 miles    Time  10    Period  Weeks    Status  New    Target Date  10/01/18      PT LONG TERM GOAL #2   Title  Patient will report no pain with intercourse to demonstrate improved functional ability.    Baseline  Intercourse is painful enough to require stopping.    Time  10    Period  Weeks    Status  New    Target Date  10/01/18      PT LONG TERM GOAL #3   Title  Patient will demonstrate functional recruitment of TA with breathing, sit-to-stand, squatting/lifting, and walking to allow for improved pelvic brace coordination, improved balance, and decreased downward pressure on the pelvic organs.    Time  10    Period  Weeks    Status  New    Target Date  10/01/18            Plan - 09/15/18 0932    Clinical Impression Statement  Pt. responded well to all interventions today, demonstrating decreased spasms, improved mobility, and decreased elbow pain, indicating decreased tension on nerve roots. Continue per POC.    Clinical Presentation  Stable    Clinical Decision Making  Moderate    Rehab Potential  Good    PT Frequency  1x / week    PT Duration  12 weeks    PT Treatment/Interventions  ADLs/Self Care Home Management;Biofeedback;Electrical Stimulation;Functional mobility training;Therapeutic activities;Therapeutic exercise;Patient/family education;Neuromuscular re-education;Manual techniques;Scar mobilization;Taping;Dry needling;Spinal Manipulations;Joint Manipulations    PT Next Visit Plan  re-assess goals, PFM cntrol, and determine new POC or D/C plan    PT Home Exercise Plan  seated posterior pelvic tilts and low-back stretch, hip-flexor stretch, side-stretch and mini marches, posterior pelvic tilts in supine with kegels, frog stretch, walking mechanics, thoracic ext over towel roll.    Consulted and Agree with Plan of Care  Patient       Patient will benefit from skilled therapeutic intervention in order to improve the following  deficits and impairments:  Increased fascial restricitons, Improper body mechanics, Pain, Decreased coordination, Decreased mobility, Decreased scar mobility, Increased muscle spasms, Postural dysfunction, Decreased endurance, Decreased strength, Decreased range of motion  Visit Diagnosis: Other muscle spasm  Other lack of coordination  Stress incontinence of urine  Dyspareunia in female     Problem List Patient Active Problem List   Diagnosis Date  Noted  . HSV 1 IgG positive 06/24/2018  . HSV-2 IgG positive 06/24/2018  . Menopause 06/23/2018  . Status post vaginal hysterectomy 06/23/2018  . STD exposure 06/23/2018   Cleophus Molt DPT, ATC Cleophus Molt 09/15/2018, 9:51 AM  [place] [facility] MAIN [facility] SERVICES [address] [place], [place], [address] Phone: [phone]   Fax:  [phone]  Name: MINDY GALI MRN: [id number] Date of Birth: 03-11-1951

## 2018-09-14 NOTE — Telephone Encounter (Signed)
The patient said she missed her call back and wanted to speak to CM, please call her back, thanks

## 2018-09-15 NOTE — Telephone Encounter (Signed)
Pt aware of mammo and dexa results.

## 2018-09-16 DIAGNOSIS — L918 Other hypertrophic disorders of the skin: Secondary | ICD-10-CM | POA: Diagnosis not present

## 2018-09-16 DIAGNOSIS — L820 Inflamed seborrheic keratosis: Secondary | ICD-10-CM | POA: Diagnosis not present

## 2018-09-20 ENCOUNTER — Ambulatory Visit: Payer: PPO

## 2018-09-20 DIAGNOSIS — M62838 Other muscle spasm: Secondary | ICD-10-CM

## 2018-09-20 DIAGNOSIS — R278 Other lack of coordination: Secondary | ICD-10-CM

## 2018-09-20 DIAGNOSIS — N941 Unspecified dyspareunia: Secondary | ICD-10-CM

## 2018-09-20 DIAGNOSIS — N393 Stress incontinence (female) (male): Secondary | ICD-10-CM

## 2018-09-20 NOTE — Therapy (Signed)
[place] MAIN [facility] SERVICES [address] [place], [place], [address] Phone: [phone]   Fax:  [phone]  Physical Therapy Treatment  Patient Details  Name: Gwendolyn Holmes MRN: [id number] Date of Birth: 11/12/1950 Referring Provider (PT): Malachi Paradise   Encounter Date: 09/20/2018  PT End of Session - 09/20/18 1012    Visit Number  10    Number of Visits  10    Date for PT Re-Evaluation  09/29/18    Authorization Type  Medicare    PT Start Time  0940    PT Stop Time  1040    PT Time Calculation (min)  60 min    Activity Tolerance  Patient tolerated treatment well    Behavior During Therapy  [facility] for tasks assessed/performed       Past Medical History:  Diagnosis Date  . Environmental allergies   . Hypothyroidism     Past Surgical History:  Procedure Laterality Date  . APPENDECTOMY    . AUGMENTATION MAMMAPLASTY Bilateral 1985  . OTHER SURGICAL HISTORY     vocal cord   . VAGINAL HYSTERECTOMY     LSH    There were no vitals filed for this visit.    Pelvic Floor Physical Therapy Treatment Note  SCREENING  Changes in medications, allergies, or medical history?:  no   SUBJECTIVE  Patient reports: Has only had one day where she had a little bit more leakage after her walk but most days it has been quite light. She is not having any leakage with her child's pose as well.  Pain update:  Location of pain: neck an dL knee Current pain:  1/10  Max pain:  1/10 Least pain:  0/10 Nature of pain: ache  Patient Goals: To not have to wear a pad at all.   OBJECTIVE  Changes in: Posture/Observations:  Pt. Demonstrates improved posture and awareness of correct body mechanics.  Range of Motion/Flexibilty:  Slightly decreased thoracic extension, lumbar flexion, and continued decreased B hip extension.  Pelvic floor: TTP throughout PFM.   INTERVENTIONS THIS SESSION: Self-care: Educated on vulvar hygiene,  Vitamin E and Coconut suppositories for vaginal dryness/lubrication for decreased pain with intercourse, and reviewed the importance of a squatty potty to help decrease downward pressure on prolapse with BM for decreased SUI. Manual: re-assessed PFM and performed TP release to L posterior PR/PC to decrease pain and spasm and allow for intercourse without pain. Therex: reviewed HEP for correct performance and specifically corrected form with stretches including hip-flexor and side-stretch to ain maximal benefit.   Total time: 60 min.                         PT Education - 09/20/18 1012    Education Details  See Pt. Instructions and Interventions this session    Person(s) Educated  Patient    Methods  Explanation;Demonstration    Comprehension  Verbalized understanding;Returned demonstration       PT Short Term Goals - 09/20/18 0950      PT SHORT TERM GOAL #1   Title  Patient will demonstrate a coordinated contraction, relaxation, and bulge of the pelvic floor muscles to demonstrate functional recruitment and motion and allow for further strengthening.    Baseline  Pt. has difficulty achieving "bulge" due to spasm    Time  6    Period  Weeks    Status  New    Target Date  09/02/18  PT SHORT TERM GOAL #2   Title  Patient will demonstrate functional recruitment of TA with breathing, sit-to-stand, squatting/lifting, and walking to allow for improved pelvic brace coordination, improved balance, and decreased downward pressure on the pelvic organs.    Time  6    Period  Weeks    Status  Achieved    Target Date  09/02/18      PT SHORT TERM GOAL #3   Title  Patient will report consistent use of foot-stool (squatty-potty) for positioning with BM to decrease pressure on pelvic organs with BM.    Baseline  Prolapse visible with increased pressure. re-educated on it, Pt. not very motivated but said she would look into it.    Time  6    Period  Weeks    Status   On-going    Target Date  09/02/18        PT Long Term Goals - 09/20/18 1000      PT LONG TERM GOAL #1   Title  Patient will report no episodes of SUI over the course of the prior two weeks to demonstrate improved functional ability.    Baseline  Using thin pad with every-day activity and #4 pad with walking 2.4 miles. Progress but still mildly damp with walking.    Time  10    Period  Weeks    Status  On-going      PT LONG TERM GOAL #2   Title  Patient will report no pain with intercourse to demonstrate improved functional ability.    Baseline  Intercourse is painful enough to require stopping.     Time  10    Period  Weeks    Status  Achieved      PT LONG TERM GOAL #3   Title  Patient will demonstrate functional recruitment of TA with breathing, sit-to-stand, squatting/lifting, and walking to allow for improved pelvic brace coordination, improved balance, and decreased downward pressure on the pelvic organs.    Time  10    Period  Weeks    Status  New            Plan - 09/20/18 1650    Clinical Impression Statement  Pt. responded well to all interventons today, demonstrating greater understanding and performance of her HEP and understanding of all education provided as well as good between-session maintenence and improvement of symptoms. She has made progress toward or met all goals but she continues to have pain with intercourse and some leakage with her walking though greatly reduced as well as minor pain in her neck (1/10) over the past week. As patient has shown such great improvement but not yet met all her goals, we will continue at a decreased frequency for 5 more visits on an every-other-week basis to achieve all goals and allow for optimal function and QOL.    Clinical Presentation  Stable    Clinical Decision Making  Moderate    Rehab Potential  Good    PT Frequency  Biweekly    PT Duration  12 weeks   10 weeks   PT Treatment/Interventions  ADLs/Self Care Home  Management;Biofeedback;Electrical Stimulation;Functional mobility training;Therapeutic activities;Therapeutic exercise;Patient/family education;Neuromuscular re-education;Manual techniques;Scar mobilization;Taping;Dry needling;Spinal Manipulations;Joint Manipulations    PT Next Visit Plan  Internal TP release, further needling and strengthening for neck and thoracic extensors.    Consulted and Agree with Plan of Care  Patient       Patient will benefit from skilled therapeutic intervention in order  to improve the following deficits and impairments:  Increased fascial restricitons, Improper body mechanics, Pain, Decreased coordination, Decreased mobility, Decreased scar mobility, Increased muscle spasms, Postural dysfunction, Decreased endurance, Decreased strength, Decreased range of motion  Visit Diagnosis: Other muscle spasm  Other lack of coordination  Stress incontinence of urine  Dyspareunia in female     Problem List Patient Active Problem List   Diagnosis Date Noted  . HSV 1 IgG positive 06/24/2018  . HSV-2 IgG positive 06/24/2018  . Menopause 06/23/2018  . Status post vaginal hysterectomy 06/23/2018  . STD exposure 06/23/2018   Willa Rough DPT, ATC Willa Rough 09/20/2018, 5:06 PM  [place] MAIN [facility] SERVICES [address] [place], [place], [address] Phone: [phone]   Fax:  [phone]  Name: Gwendolyn Holmes MRN: [id number] Date of Birth: [birth year]

## 2018-09-20 NOTE — Patient Instructions (Addendum)
Your Vagina is Not Cussing! One of the most fascinating things I've learned as a pelvic floor physical therapist is that women really have a variety of ways that they wash their crotch. Should that be "crotches"? Can you make that plural? If not, why not? Tell me that.. But, cleaning the crotch - it's important. We clean our face, our armpits and our feet. The crotch has got a lot going on so it should be cleaned too, right? Women clean themselves differently, but that's not necessarily okay. There are some basic facts that are important to know when it comes to keeping your machine well-oiled and running, regardless of whether she's a Gore or a 2015 [employer]; cuz either way she's a beauty, right? So what is the right way for a woman to clean her vulvovaginal area in order to ensure cleanliness, odor reduction and avoidance of infection? Let's start with what I hear from patients: 1. "I usually douche because that's what my mother did." 2. "I use a lavender scented soap all over my body and I get a wash rag and scrub my vulva." 3. "I spread my labias and get soap on them and then I put soap inside my vagina. I'm very clean." 4. "I'm careful, so I go front to back with the soap. I start at the vulva and soap it real good, then I reach over to my anus and get that soapy." 5. "I use a loofa on my vulva and then after I shower I spray a little perfume down there. You never know what's going to happen that day." Friends, Romans, Countrywomen - lend me your ear! All these people are WRONG! (and that's probably one reason why they're seeing me in the first place) If you want my advice, I'm going to be succinct, clear and direct. You can wash your vulvovaginal area any way you'd like as long as you are in the shower, eliminate all soap and let warm water run over the area and only use your hands. Just call me the Lorene Dy of the vagina.or is that weird?  Here's what I want you to  do: 1. Wash your hair. 2. Wash your body with soap. 3. Rinse everything off. 4. Let warm water rinse over your labias. Yes, you can spread your labias. 5. Let warm water rinse over your anus. 6. Get out of the shower.** 7. Gently and lovingly pat the vulva dry and put on white, cotton underwear. **You can wash your hands before getting out. So why am I so restricting? Here's why: 1. The vagina is self-cleansing. There is no need to douche or soap inside the vagina. It's already got a good bacteria called lactobacilli that has several important functions. Lactobacilli eats up bad bacteria that can cause infection, it keeps the vagina acidic in order to reduce the likelihood of infections and it's even postulated that lactobacilli can prompt the immune system. This helps reduce odor, infection and keeps the natural flora healthy. Oh, and get this - estrogen helps to feed lactobacilli. So if you're low on estrogen, it makes sense that you might be prone to more infections. Please, just don't use soap on the vulva or in the vagina. Trust me, your vagina is not cussing. (Ironically enough, the inside of the mouth is made up of the same durable tissue as the inside of the vagina.) 2. The vulva wasn't meant to be scrubbed - it's not a potato. The vulva is sensitive  like your fingertips, the skin around your eyes and your lips. It's meant to detect fine detail (for pleasure), so being forceful with it is going to make it more sensitive in a negative way - hypersensitive (for pain). Scrubbing can remove a fine layer of the vulvovaginal tissue which can create an anti-histamine response - much like scrubbing your arm would make your arm red. This creates an inflammatory cascade of events. Many people will heal from this quite quickly and may not notice any discomfort, but others may start to notice some irritation after some time. This is when you might start noticing sensitivity to things that never bothered you  before like tight clothes, colorful underwear, lubricants or laundry detergent. 3. Scented items (or items with chemicals) like perfume (on the vulva), soap, bubble bath or even flavored or hot/cold/tingly/prickly/naughty sexual lubricant/condoms should be avoided as well because they could irritate the opening of the vagina (the vulvar vestibule) or the vagina itself. The vulvar vestibule is made of up different tissue than the vagina (but the same tissue as the urethra and bladder), so it's possible that using chemical products here can cause pain and the symptoms of a urinary tract infection (UTI). 4. The vagina needs to breathe. Wearing tight, conforming clothing all the time or daily pantyliners can be suffocating to your vulvovaginal area and irritating to the skin. Give it a break sometimes and wear looser clothing and or no underwear at all (like at night). 5. If you have a sensitive vulva or are prone to a lot of symptoms of infections, consider wearing white, cotton underwear instead of the fancy stuff. Over time, it's possible to develop new allergies and unfortunately, some women develop allergies to synthetic materials and dyes in their underwear. This also means it's best to wash your underwear with a detergent that is made for sensitive skin and is free of chemicals. ** Note - we will expand this area in the near future (with Sara's blessings) to include other options for under wear or safe liners. Stay tuned!  And get this: Discharge doesn't mean you are dirty. Discharge is natural and comes from a variety of places, most of which are not the vagina itself. What you see on your underwear is a mix of oil and gland secretions from the vulva and it's also secretions from the uterus and the fallopian tubes. Discharge changes during different parts of the menstrual cycle because it serves different purposes. For example, when you are ovulating, the discharge is a different consistency so that sperm  can pass through it more easily. It's all normal and healthy. However, if it starts to change colors or smell really funky - this indicates a possible issue with an area that is potentially apart from the vagina. Soaping and scrubbing to high Charlean Sanfilippo is not going to fix this - you really need to get checked out by a doctor in this situation. Taking care of the vulvovaginal tissue is easier than we want it to be. Less is more. So much more. Good, simple vulvovaginal hygiene means better flora (not fauna), reduced odor, less itching and less discomfort. So cheers to you and your polite vagina. That little number was raised right! -Lavone Orn. Sauder PT, DPT    Vitamin E oil suppositories can be helpful for vaginal dryness and lubrication.

## 2018-10-05 ENCOUNTER — Ambulatory Visit: Payer: PPO

## 2018-10-05 DIAGNOSIS — M62838 Other muscle spasm: Secondary | ICD-10-CM

## 2018-10-05 DIAGNOSIS — N941 Unspecified dyspareunia: Secondary | ICD-10-CM

## 2018-10-05 DIAGNOSIS — N393 Stress incontinence (female) (male): Secondary | ICD-10-CM

## 2018-10-05 DIAGNOSIS — R278 Other lack of coordination: Secondary | ICD-10-CM

## 2018-10-05 NOTE — Therapy (Signed)
[place] [facility] MAIN [facility] [address] [place], [place], [address] Phone: [phone]   Fax:  [phone]  Physical Therapy Treatment  Patient Details  Name: Gwendolyn Holmes MRN: [id number] Date of Birth: 1951-11-07 Referring Provider (PT): Sharon SellerMartin Defrancesco   Encounter Date: 10/05/2018  PT End of Session - 10/05/18 1631    Visit Number  1    Number of Visits  3    Date for PT Re-Evaluation  11/29/18    Authorization Type  Medicare    PT Start Time  1505    PT Stop Time  1610    PT Time Calculation (min)  65 min    Activity Tolerance  Patient tolerated treatment well    Behavior During Therapy  [facility] for tasks assessed/performed       Past Medical History:  Diagnosis Date  . Environmental allergies   . Hypothyroidism     Past Surgical History:  Procedure Laterality Date  . APPENDECTOMY    . AUGMENTATION MAMMAPLASTY Bilateral 1985  . OTHER SURGICAL HISTORY     vocal cord   . VAGINAL HYSTERECTOMY     LSH    There were no vitals filed for this visit.    Pelvic Floor Physical Therapy Treatment Note  SCREENING  Changes in medications, allergies, or medical history?: no    SUBJECTIVE  Patient reports: Things are going really well. Today was the first day she had a little more leakage but it has been really good. Yesterday she went out of the house without any panty-liner and had any leakage. Is having just a little pan in the R hip and thigh and her neck had stiffened up but loosened up today during her walk. Has been really active decorating her house for christmas. Has not been using soap on her "ya-ya"    Precautions:  fibromyalgia  Pain update:  Location of pain: R hip/low back and neck Current pain:  1/10  Max pain:  2/10 Least pain:  0/10 Nature of pain: ache  Patient Goals: To not have to wear a pad at all.   OBJECTIVE  Changes in: Posture/Observations:  Mild hyperkyphosis  Range of  Motion/Flexibilty:  Decreased mobility through thoracic spine.  Palpation: TTP to R Piriformis, Glute min, L thoracic paraspinals and B upper trapezius.     INTERVENTIONS THIS SESSION: Therex: reviewed hip flexor stretch to improve efficacy and allow for decreased tightness pulling into sway back position. Reviewed the importance of the towel roll extensions on decreasing pain and stiffness in the neck/shoulders and elbows. Self-care: reviewed how to find the vitamin-E suppositories and what to expect as far as increased discharge with healthier vaginal flora so she is not confused and thinking she has leakage when mild discharge returns. Manual: Performed TP release to B Upper traps, R Piriformis, Glute min, and L thoracic paraspinals to decrease pain and spasm and allow for improved posture and PFM recruitment. Dry needle: Performed TPDN with a .30x11060mm needle to R Piriformis and Glute min to decrease pain and spasm and take pressure off of sacral nerve roots for improved PFM function and decreased leakage.  Total time: 65 min.                          PT Education - 10/05/18 1630    Education Details  See Pt. Instructions and Interventions this session.    Person(s) Educated  Patient    Methods  Explanation;Verbal cues;Demonstration;Handout    Comprehension  Verbalized understanding;Returned demonstration;Verbal cues required       PT Short Term Goals - 09/20/18 0950      PT SHORT TERM GOAL #1   Title  Patient will demonstrate a coordinated contraction, relaxation, and bulge of the pelvic floor muscles to demonstrate functional recruitment and motion and allow for further strengthening.    Baseline  Pt. has difficulty achieving "bulge" due to spasm    Time  6    Period  Weeks    Status  Achieved    Target Date  09/02/18      PT SHORT TERM GOAL #2   Title  Patient will demonstrate functional recruitment of TA with breathing, sit-to-stand, squatting/lifting,  and walking to allow for improved pelvic brace coordination, improved balance, and decreased downward pressure on the pelvic organs.    Time  6    Period  Weeks    Status  Achieved    Target Date  09/02/18      PT SHORT TERM GOAL #3   Title  Patient will report consistent use of foot-stool (squatty-potty) for positioning with BM to decrease pressure on pelvic organs with BM.    Baseline  Prolapse visible with increased pressure. re-educated on it, Pt. not very motivated but said she would look into it.    Time  6    Period  Weeks    Status  On-going    Target Date  09/02/18        PT Long Term Goals - 09/20/18 1000      PT LONG TERM GOAL #1   Title  Patient will report no episodes of SUI over the course of the prior two weeks to demonstrate improved functional ability.    Baseline  Using thin pad with every-day activity and #4 pad with walking 2.4 miles. Progress but still mildly damp with walking.    Time  10    Period  Weeks    Status  On-going    Target Date  11/29/18      PT LONG TERM GOAL #2   Title  Patient will report no pain with intercourse to demonstrate improved functional ability.    Baseline  Intercourse is painful enough to require stopping. Has pain with initial penetration, but it gets better as it goes    Time  10    Period  Weeks    Status  On-going    Target Date  11/29/18      PT LONG TERM GOAL #3   Title  Patient will demonstrate functional recruitment of TA with breathing, sit-to-stand, squatting/lifting, and walking to allow for improved pelvic brace coordination, improved balance, and decreased downward pressure on the pelvic organs.    Time  10    Period  Weeks    Status  Achieved    Target Date  10/01/18            Plan - 10/05/18 1632    Clinical Impression Statement  Pt. responded well to all interventions, demonstrating complete reduction of pain and spasm and understanding of all education provided. Continue per POC.    Clinical  Presentation  Stable    Clinical Decision Making  Moderate    Rehab Potential  Good    PT Frequency  Biweekly    PT Duration  Other (comment)   10 weeks   PT Treatment/Interventions  ADLs/Self Care Home Management;Biofeedback;Electrical Stimulation;Functional mobility training;Therapeutic activities;Therapeutic exercise;Patient/family education;Neuromuscular re-education;Manual techniques;Scar  mobilization;Taping;Dry needling;Spinal Manipulations;Joint Manipulations    PT Next Visit Plan  Internal TP release, thoracic mobility and DN to paraspinals/multifidus PRN, further needling and strengthening for neck and thoracic extensors.    PT Home Exercise Plan  seated posterior pelvic tilts and low-back stretch, hip-flexor stretch, side-stretch and mini marches, posterior pelvic tilts in supine with kegels, frog stretch, walking mechanics, thoracic ext over towel roll.    Consulted and Agree with Plan of Care  Patient       Patient will benefit from skilled therapeutic intervention in order to improve the following deficits and impairments:  Increased fascial restricitons, Improper body mechanics, Pain, Decreased coordination, Decreased mobility, Decreased scar mobility, Increased muscle spasms, Postural dysfunction, Decreased endurance, Decreased strength, Decreased range of motion  Visit Diagnosis: Other muscle spasm  Other lack of coordination  Stress incontinence of urine  Dyspareunia in female     Problem List Patient Active Problem List   Diagnosis Date Noted  . HSV 1 IgG positive 06/24/2018  . HSV-2 IgG positive 06/24/2018  . Menopause 06/23/2018  . Status post vaginal hysterectomy 06/23/2018  . STD exposure 06/23/2018   Cleophus Molt DPT, ATC Cleophus Molt 10/05/2018, 4:36 PM  [place] [facility] MAIN [facility] SERVICES [address] [place], [place], [address] Phone: [phone]   Fax:  [phone]  Name: ANWAR CRILL MRN:  [id number] Date of Birth: 05/07/1951

## 2018-10-05 NOTE — Patient Instructions (Signed)
  Look for [employer]-E suppositories (vitamin E suppositories)    ** Keep your back up straight when stretching your hip to feel the stretch.

## 2018-10-19 ENCOUNTER — Ambulatory Visit: Payer: PPO | Attending: Obstetrics and Gynecology

## 2018-10-19 DIAGNOSIS — M62838 Other muscle spasm: Secondary | ICD-10-CM

## 2018-10-19 DIAGNOSIS — R278 Other lack of coordination: Secondary | ICD-10-CM | POA: Insufficient documentation

## 2018-10-19 DIAGNOSIS — N941 Unspecified dyspareunia: Secondary | ICD-10-CM | POA: Diagnosis not present

## 2018-10-19 DIAGNOSIS — N393 Stress incontinence (female) (male): Secondary | ICD-10-CM | POA: Insufficient documentation

## 2018-10-19 NOTE — Therapy (Signed)
[place] MAIN [facility] SERVICES [address] [place], [place], [address] Phone: [phone]   Fax:  [phone]  Physical Therapy Treatment  Patient Details  Name: Gwendolyn Holmes MRN: [id number] Date of Birth: [birth year] Referring Provider (PT): Malachi Paradise   Encounter Date: 10/19/2018  PT End of Session - 10/20/18 0955    Visit Number  2    Number of Visits  3    Date for PT Re-Evaluation  11/29/18    Authorization Type  Medicare    PT Start Time  1410    PT Stop Time  1500    PT Time Calculation (min)  50 min    Activity Tolerance  Patient tolerated treatment well    Behavior During Therapy  [facility] for tasks assessed/performed       Past Medical History:  Diagnosis Date  . Environmental allergies   . Hypothyroidism     Past Surgical History:  Procedure Laterality Date  . APPENDECTOMY    . AUGMENTATION MAMMAPLASTY Bilateral 1985  . OTHER SURGICAL HISTORY     vocal cord   . VAGINAL HYSTERECTOMY     [facility]    There were no vitals filed for this visit.    Pelvic Floor Physical Therapy Treatment Note  SCREENING  Changes in medications, allergies, or medical history?: no    SUBJECTIVE  Patient reports: She feels great, her neck and arms even feel great. She even cancelled her next two-week appointment because she feels great. Was able to hoe the garden and not wear a pad. Is having a little bit when walking but is able to use her posterior pelvic tilts to help not have leakage the rest of the day or the next day.  Is not having any leakage at night and is able to hold more urine without leakage.   Pain update: A little tightness across th upper back from doing yard work but no real pain to speak of.  Patient Goals: To not have to wear a pad at all.   OBJECTIVE  INTERVENTIONS THIS SESSION: Self-care: educated on how to perform urge-suppression technique should she find herself having any urge incontinence in the  future since this had been a concern once though it has not been a problem recently. Educated on the potential application of poise bladder supports for days where she knows she will have to exert more force such as going hiking or helping someone move etc. Educated on when to seek out help if needed to maintain improvement in pain should she have any return including how to find a chiropractor, massage therapist, or outpatient PT who is capable to performing similar treatments to what we did during her treatment. Reviewed goals and decided on POC to D/C.  Total time: 50 min.                          PT Education - 10/20/18 0955    Education Details  See Pt. Instructions and Interventions this session.    Person(s) Educated  Patient    Methods  Explanation    Comprehension  Verbalized understanding       PT Short Term Goals - 10/19/18 1520      PT SHORT TERM GOAL #1   Title  Patient will demonstrate a coordinated contraction, relaxation, and bulge of the pelvic floor muscles to demonstrate functional recruitment and motion and allow for further strengthening.    Baseline  Pt. has difficulty achieving "bulge" due to spasm    Time  6    Period  Weeks    Status  Achieved    Target Date  09/02/09      PT SHORT TERM GOAL #2   Title  Patient will demonstrate functional recruitment of TA with breathing, sit-to-stand, squatting/lifting, and walking to allow for improved pelvic brace coordination, improved balance, and decreased downward pressure on the pelvic organs.    Time  6    Period  Weeks    Status  Achieved    Target Date  09/02/18      PT SHORT TERM GOAL #3   Title  Patient will report consistent use of foot-stool (squatty-potty) for positioning with BM to decrease pressure on pelvic organs with BM.    Baseline  Prolapse visible with increased pressure. re-educated on it, Pt. not very motivated but said she would look into it.    Time  6    Period  Weeks     Status  On-going        PT Long Term Goals - 10/19/18 1546      PT LONG TERM GOAL #1   Title  Patient will report no episodes of SUI over the course of the prior two weeks to demonstrate improved functional ability.    Baseline  Using thin pad with every-day activity and #4 pad with walking 2.4 miles. Progress but still mildly damp with walking.    Time  10    Period  Weeks    Status  Achieved    Target Date  11/29/18      PT LONG TERM GOAL #2   Title  Patient will report no pain with intercourse to demonstrate improved functional ability.    Baseline  Intercourse is painful enough to require stopping. Has pain with initial penetration, but it gets better as it goes    Time  10    Period  Weeks    Status  Partially Met    Target Date  11/29/18      PT LONG TERM GOAL #3   Title  Patient will demonstrate functional recruitment of TA with breathing, sit-to-stand, squatting/lifting, and walking to allow for improved pelvic brace coordination, improved balance, and decreased downward pressure on the pelvic organs.    Time  10    Period  Weeks    Status  Achieved    Target Date  10/01/18            Plan - 10/20/18 0955    Clinical Impression Statement  Pt. responded well to all interventions today, demonstrating understanding of all education provided and reporting significant improvement in all of her symptoms as well as having met almost every goal with confidence that she will be able to continue improving through use of her HEP. We will D/C at this time to HEP.    Clinical Presentation  Stable    Clinical Decision Making  Moderate    Rehab Potential  Good    PT Frequency  Biweekly    PT Duration  Other (comment)    PT Treatment/Interventions  ADLs/Self Care Home Management;Biofeedback;Electrical Stimulation;Functional mobility training;Therapeutic activities;Therapeutic exercise;Patient/family education;Neuromuscular re-education;Manual techniques;Scar  mobilization;Taping;Dry needling;Spinal Manipulations;Joint Manipulations    PT Next Visit Plan  D/C    PT Home Exercise Plan  seated posterior pelvic tilts and low-back stretch, hip-flexor stretch, side-stretch and mini marches, posterior pelvic tilts in supine with kegels, frog stretch, walking mechanics, thoracic ext  over towel roll.    Consulted and Agree with Plan of Care  Patient       Patient will benefit from skilled therapeutic intervention in order to improve the following deficits and impairments:  Increased fascial restricitons, Improper body mechanics, Pain, Decreased coordination, Decreased mobility, Decreased scar mobility, Increased muscle spasms, Postural dysfunction, Decreased endurance, Decreased strength, Decreased range of motion  Visit Diagnosis: Other muscle spasm  Other lack of coordination  Stress incontinence of urine  Dyspareunia in female     Problem List Patient Active Problem List   Diagnosis Date Noted  . HSV 1 IgG positive 06/24/2018  . HSV-2 IgG positive 06/24/2018  . Menopause 06/23/2018  . Status post vaginal hysterectomy 06/23/2018  . STD exposure 06/23/2018   Willa Rough DPT, ATC Willa Rough 10/20/2018, 10:03 AM  [place] MAIN [facility] SERVICES [place], [place], [address] Phone: [phone]   Fax:  [phone]  Name: MILLENIA WALDVOGEL MRN: [id number] Date of Birth: [birth year]

## 2018-10-19 NOTE — Patient Instructions (Addendum)
  Option for high-intensity days.   Urge supression technique:  1) Take a deep breath to convince yourself that you are in control and calm the nervous system.  2) Do 5 "quick-flick" kegels (pelvic floor muscle contractions) and re-assess the urge. Repeat another set if urge is still present. 3) Once the urge has decreased, start walking calmly to the the bathroom. Stop and repeat steps 1 and 2 as many times as needed until you can successfully get to the toilet. 4) Only once seated, take a deep breath and allow the pelvic floor muscles to relax and allow for the urine to flow.    Do not be discouraged if you are not successful the first couple times, this is normal and it will take practice but remember that YOU are in control. Start by practicing this at home where you do not have to worry as much if there were to be an accident. Allowing yourself to get rushed or nervous puts the bladder back in control and will not allow the technique to work.   If you look into chiropractic, ask if they can do a mobilization rather than a manipulation because you are uncomfortable with being "cracked"   If you get a massage look for words like deep-tissue, trigger point release, myofascial release/work and body work.     You can also look into Dry-needling specifically at an outpatient clinic if you need.  [facility]  [address], [place], [place] [id number]

## 2019-05-17 DIAGNOSIS — H6062 Unspecified chronic otitis externa, left ear: Secondary | ICD-10-CM | POA: Diagnosis not present

## 2019-05-17 DIAGNOSIS — H9202 Otalgia, left ear: Secondary | ICD-10-CM | POA: Diagnosis not present

## 2019-06-28 ENCOUNTER — Other Ambulatory Visit: Payer: Self-pay

## 2019-06-28 ENCOUNTER — Encounter: Payer: PPO | Admitting: Obstetrics and Gynecology

## 2019-06-28 ENCOUNTER — Encounter: Payer: Self-pay | Admitting: Obstetrics and Gynecology

## 2019-06-28 ENCOUNTER — Ambulatory Visit (INDEPENDENT_AMBULATORY_CARE_PROVIDER_SITE_OTHER): Payer: PPO | Admitting: Obstetrics and Gynecology

## 2019-06-28 VITALS — BP 111/69 | HR 72 | Ht 61.0 in | Wt 128.3 lb

## 2019-06-28 DIAGNOSIS — Z90710 Acquired absence of both cervix and uterus: Secondary | ICD-10-CM | POA: Diagnosis not present

## 2019-06-28 DIAGNOSIS — M85859 Other specified disorders of bone density and structure, unspecified thigh: Secondary | ICD-10-CM | POA: Insufficient documentation

## 2019-06-28 DIAGNOSIS — Z79890 Hormone replacement therapy (postmenopausal): Secondary | ICD-10-CM | POA: Diagnosis not present

## 2019-06-28 DIAGNOSIS — Z01419 Encounter for gynecological examination (general) (routine) without abnormal findings: Secondary | ICD-10-CM | POA: Diagnosis not present

## 2019-06-28 DIAGNOSIS — E039 Hypothyroidism, unspecified: Secondary | ICD-10-CM

## 2019-06-28 DIAGNOSIS — Z1239 Encounter for other screening for malignant neoplasm of breast: Secondary | ICD-10-CM | POA: Diagnosis not present

## 2019-06-28 DIAGNOSIS — N644 Mastodynia: Secondary | ICD-10-CM | POA: Diagnosis not present

## 2019-06-28 NOTE — Progress Notes (Signed)
ANNUAL PREVENTATIVE CARE GYNECOLOGY  ENCOUNTER NOTE  Subjective:       Gwendolyn Holmes is a 68 y.o. G30P1001 female here for a routine annual gynecologic exam. The patient is sexually active. The patient is taking hormone replacement therapy (has been using for 12-13 years, has tried weaning in the past without success). Patient denies post-menopausal vaginal bleeding. The patient wears seatbelts: yes. The patient participates in regular exercise: yes (walking). Has the patient ever been transfused or tattooed?: no. The patient reports that there is not domestic violence in her life.  Current complaints: 1.  Patient notes some mild intermittent left breast tenderness over the past several weeks. Denies any changes in diet, does not excessively consume caffeine.  Does not have a personal or family history of breast cancer, but does note a remote history of breast cysts.    Gynecologic History No LMP recorded. Patient has had a hysterectomy. Contraception: status post hysterectomy Last Pap: patient has had hysterectomy.  Denies any h/o abnormal pap smears.  Last mammogram: 08/2018 . Results were: normal Last Colonoscopy: 3 years ago. Normal. Due for repeat in 7 years.  Last Dexa Scan: 09/09/2018. Osteopenia (T score -1.1)   Obstetric History OB History  Gravida Para Term Preterm AB Living  1 1 1     1   SAB TAB Ectopic Multiple Live Births          1    # Outcome Date GA Lbr Len/2nd Weight Sex Delivery Anes PTL Lv  1 Term 1968   6 lb 6.4 oz (2.903 kg) M Vag-Spont   LIV    Past Medical History:  Diagnosis Date  . Environmental allergies   . Hypothyroidism     Family History  Problem Relation Age of Onset  . Breast cancer Neg Hx   . Ovarian cancer Neg Hx   . Colon cancer Neg Hx   . Diabetes Neg Hx     Past Surgical History:  Procedure Laterality Date  . APPENDECTOMY    . AUGMENTATION MAMMAPLASTY Bilateral 1985  . OTHER SURGICAL HISTORY     vocal cord   . VAGINAL  HYSTERECTOMY     [place]    Social History   Socioeconomic History  . Marital status: Legally Separated    Spouse name: Not on file  . Number of children: Not on file  . Years of education: Not on file  . Highest education level: Not on file  Occupational History  . Not on file  Social Needs  . Financial resource strain: Not on file  . Food insecurity    Worry: Not on file    Inability: Not on file  . Transportation needs    Medical: Not on file    Non-medical: Not on file  Tobacco Use  . Smoking status: Never Smoker  . Smokeless tobacco: Never Used  Substance and Sexual Activity  . Alcohol use: Yes    Comment: 2 x a  week  . Drug use: Never  . Sexual activity: Yes    Birth control/protection: Surgical  Lifestyle  . Physical activity    Days per week: 5 days    Minutes per session: 40 min  . Stress: Not on file  Relationships  . Social Herbalist on phone: Not on file    Gets together: Not on file    Attends religious service: Not on file    Active member of club or organization: Not on file  Attends meetings of clubs or organizations: Not on file    Relationship status: Not on file  . Intimate partner violence    Fear of current or ex partner: Not on file    Emotionally abused: Not on file    Physically abused: Not on file    Forced sexual activity: Not on file  Other Topics Concern  . Not on file  Social History Narrative  . Not on file    Current Outpatient Medications on File Prior to Visit  Medication Sig Dispense Refill  . aspirin 325 MG tablet Take 325 mg by mouth daily.    Marland Kitchen. azelastine (ASTELIN) 0.1 % nasal spray Place into both nostrils 2 (two) times daily. Use in each nostril as directed    . cholecalciferol (VITAMIN D) 400 units TABS tablet Take 400 Units by mouth 2 (two) times daily.    Marland Kitchen. estradiol (ESTRACE) 0.5 MG tablet Take 1 tablet (0.5 mg total) by mouth daily. 90 tablet 3  . fenoprofen (NALFON) 600 MG TABS tablet Take 600 mg by  mouth 2 (two) times daily.    Marland Kitchen. levothyroxine (SYNTHROID, LEVOTHROID) 50 MCG tablet TAKE 1 TABLET BY MOUTH EVERY DAY ON AN EMPTY STOMACH AT LEAST 30 TO 60 MINUTES BEFORE BREAKFAST    . montelukast (SINGULAIR) 10 MG tablet TK 1 T PO NIGHTLY  11  . Red Yeast Rice 600 MG CAPS Take by mouth.     No current facility-administered medications on file prior to visit.     Allergies  Allergen Reactions  . Latex Rash      Review of Systems ROS Review of Systems - General ROS: negative for - chills, fatigue, fever, hot flashes, night sweats, weight gain or weight loss Psychological ROS: negative for - anxiety, decreased libido, depression, mood swings, physical abuse or sexual abuse Ophthalmic ROS: negative for - blurry vision, eye pain or loss of vision ENT ROS: negative for - headaches, hearing change, visual changes or vocal changes Allergy and Immunology ROS: negative for - hives, itchy/watery eyes or seasonal allergies Hematological and Lymphatic ROS: negative for - bleeding problems, bruising, swollen lymph nodes or weight loss Endocrine ROS: negative for - galactorrhea, hair pattern changes, hot flashes, malaise/lethargy, mood swings, palpitations, polydipsia/polyuria, skin changes, temperature intolerance or unexpected weight changes Breast ROS: negative for - new or changing breast lumps or nipple discharge. Positive for intermittent left breast tenderness.  Respiratory ROS: negative for - cough or shortness of breath Cardiovascular ROS: negative for - chest pain, irregular heartbeat, palpitations or shortness of breath Gastrointestinal ROS: no abdominal pain, change in bowel habits, or black or bloody stools Genito-Urinary ROS: no dysuria, trouble voiding, or hematuria Musculoskeletal ROS: negative for - joint pain or joint stiffness Neurological ROS: negative for - bowel and bladder control changes Dermatological ROS: negative for rash and skin lesion changes   Objective:   BP  111/69   Pulse 72   Ht 5\' 1"  (1.549 m)   Wt 128 lb 4.8 oz (58.2 kg)   BMI 24.24 kg/m  CONSTITUTIONAL: Well-developed, well-nourished female in no acute distress.  PSYCHIATRIC: Normal mood and affect. Normal behavior. Normal judgment and thought content. NEUROLGIC: Alert and oriented to person, place, and time. Normal muscle tone coordination. No cranial nerve deficit noted. HENT:  Normocephalic, atraumatic, External right and left ear normal. Oropharynx is clear and moist EYES: Conjunctivae and EOM are normal. Pupils are equal, round, and reactive to light. No scleral icterus.  NECK: Normal range of motion,  supple, no masses.  Normal thyroid.  SKIN: Skin is warm and dry. No rash noted. Not diaphoretic. No erythema. No pallor. CARDIOVASCULAR: Normal heart rate noted, regular rhythm, no murmur. RESPIRATORY: Clear to auscultation bilaterally. Effort and breath sounds normal, no problems with respiration noted. BREASTS: Symmetric in size. No masses, skin changes, nipple drainage, or lymphadenopathy. ABDOMEN: Soft, normal bowel sounds, no distention noted.  No tenderness, rebound or guarding.  BLADDER: Normal PELVIC:  Bladder no bladder distension noted  Urethra: normal appearing urethra with no masses, tenderness or lesions  Vulva: normal appearing vulva with no masses, tenderness or lesions  Vagina: normal appearing vagina with normal color, no lesions. Scant curd-like discharge in vault.   Cervix: surgically absent  Uterus: surgically absent, vaginal cuff well healed  Adnexa: normal adnexa in size, nontender and no masses  RV: External Exam NormaI, No Rectal Masses and Normal Sphincter tone  MUSCULOSKELETAL: Normal range of motion. No tenderness.  No cyanosis, clubbing, or edema.  2+ distal pulses. LYMPHATIC: No Axillary, Supraclavicular, or Inguinal Adenopathy.   Labs: No results found for: WBC, HGB, HCT, MCV, PLT  No results found for: CREATININE, BUN, NA, K, CL, CO2  No results  found for: ALT, AST, GGT, ALKPHOS, BILITOT  No results found for: CHOL, HDL, LDLCALC, LDLDIRECT, TRIG, CHOLHDL  No results found for: TSH  No results found for: HGBA1C   Assessment:   Encounter for well woman exam with routine gynecological exam  Breast cancer screening  Breast tenderness in female Osteopenia of hip, unspecified laterality S/P hysterectomy  Post-menopause on HRT (hormone replacement therapy) Acquired hypothyroidism   Plan:  - Pap: Not needed.  Patient is s/p hysterectomy.  - Mammogram: Ordered - Stool Guaiac Testing:  Not Ordered. Up to date on colonoscopy.  - Labs: To schedule visit with PCP in the next month or two and have labs done then - Routine preventative health maintenance measures emphasized: Exercise/Diet/Weight control, Tobacco Warnings, Alcohol/Substance use risks, Stress Management and Safe Sex.  - Breast tenderness with no significant findings today. Discussed self-care management, increasing magnesium intake, taking a multivitamin. For mammogram in 2 months.  - Osteopenia, continue Vitamin D and Calcium supplementation.   - Hypothyroidism managed by PCP.  - Postmenopausal on HRT.  This woman has a 12-13 year history of HRT exposure with new study data showing increased risk of thrombo-embolic events such as myocardial infarction stroke and breast cancer after 4 or more years exposure to combination products with estrogen and progesterone. We discussed concerns about continued use of hormonal replacement therapy, and understands the benefits as well as the risks discussed above. She  is advised that she may wish to discontinue the HRT at any time, and encouraged to discuss this with her other physicians also. Her personal risk factors have been reviewed carefully with her today. - Return to Clinic - 1 Year   Hildred LaserAnika Zoe Creasman, MD  [facility]

## 2019-06-28 NOTE — Patient Instructions (Addendum)
Health Maintenance for Postmenopausal Women Menopause is a normal process in which your ability to get pregnant comes to an end. This process happens slowly over many months or years, usually between the ages of 62 and 18. Menopause is complete when you have missed your menstrual periods for 12 months. It is important to talk with your health care provider about some of the most common conditions that affect women after menopause (postmenopausal women). These include heart disease, cancer, and bone loss (osteoporosis). Adopting a healthy lifestyle and getting preventive care can help to promote your health and wellness. The actions you take can also lower your chances of developing some of these common conditions. What should I know about menopause? During menopause, you may get a number of symptoms, such as:  Hot flashes. These can be moderate or severe.  Night sweats.  Decrease in sex drive.  Mood swings.  Headaches.  Tiredness.  Irritability.  Memory problems.  Insomnia. Choosing to treat or not to treat these symptoms is a decision that you make with your health care provider. Do I need hormone replacement therapy?  Hormone replacement therapy is effective in treating symptoms that are caused by menopause, such as hot flashes and night sweats.  Hormone replacement carries certain risks, especially as you become older. If you are thinking about using estrogen or estrogen with progestin, discuss the benefits and risks with your health care provider. What is my risk for heart disease and stroke? The risk of heart disease, heart attack, and stroke increases as you age. One of the causes may be a change in the body's hormones during menopause. This can affect how your body uses dietary fats, triglycerides, and cholesterol. Heart attack and stroke are medical emergencies. There are many things that you can do to help prevent heart disease and stroke. Watch your blood pressure  High  blood pressure causes heart disease and increases the risk of stroke. This is more likely to develop in people who have high blood pressure readings, are of African descent, or are overweight.  Have your blood pressure checked: ? Every 3-5 years if you are 76-7 years of age. ? Every year if you are 45 years old or older. Eat a healthy diet   Eat a diet that includes plenty of vegetables, fruits, low-fat dairy products, and lean protein.  Do not eat a lot of foods that are high in solid fats, added sugars, or sodium. Get regular exercise Get regular exercise. This is one of the most important things you can do for your health. Most adults should:  Try to exercise for at least 150 minutes each week. The exercise should increase your heart rate and make you sweat (moderate-intensity exercise).  Try to do strengthening exercises at least twice each week. Do these in addition to the moderate-intensity exercise.  Spend less time sitting. Even light physical activity can be beneficial. Other tips  Work with your health care provider to achieve or maintain a healthy weight.  Do not use any products that contain nicotine or tobacco, such as cigarettes, e-cigarettes, and chewing tobacco. If you need help quitting, ask your health care provider.  Know your numbers. Ask your health care provider to check your cholesterol and your blood sugar (glucose). Continue to have your blood tested as directed by your health care provider. Do I need screening for cancer? Depending on your health history and family history, you may need to have cancer screening at different stages of your life. This  may include screening for:  Breast cancer.  Cervical cancer.  Lung cancer.  Colorectal cancer. What is my risk for osteoporosis? After menopause, you may be at increased risk for osteoporosis. Osteoporosis is a condition in which bone destruction happens more quickly than new bone creation. To help prevent  osteoporosis or the bone fractures that can happen because of osteoporosis, you may take the following actions:  If you are 7919-69 years old, get at least 1,000 mg of calcium and at least 600 mg of vitamin D per day.  If you are older than age 69 but younger than age 69, get at least 1,200 mg of calcium and at least 600 mg of vitamin D per day.  If you are older than age 69, get at least 1,200 mg of calcium and at least 800 mg of vitamin D per day. Smoking and drinking excessive alcohol increase the risk of osteoporosis. Eat foods that are rich in calcium and vitamin D, and do weight-bearing exercises several times each week as directed by your health care provider. How does menopause affect my mental health? Depression may occur at any age, but it is more common as you become older. Common symptoms of depression include:  Low or sad mood.  Changes in sleep patterns.  Changes in appetite or eating patterns.  Feeling an overall lack of motivation or enjoyment of activities that you previously enjoyed.  Frequent crying spells. Talk with your health care provider if you think that you are experiencing depression. General instructions See your health care provider for regular wellness exams and vaccines. This may include:  Scheduling regular health, dental, and eye exams.  Getting and maintaining your vaccines. These include: ? Influenza vaccine. Get this vaccine each year before the flu season begins. ? Pneumonia vaccine. ? Shingles vaccine. ? Tetanus, diphtheria, and pertussis (Tdap) booster vaccine. Your health care provider may also recommend other immunizations. Tell your health care provider if you have ever been abused or do not feel safe at home. Summary  Menopause is a normal process in which your ability to get pregnant comes to an end.  This condition causes hot flashes, night sweats, decreased interest in sex, mood swings, headaches, or lack of sleep.  Treatment for this  condition may include hormone replacement therapy.  Take actions to keep yourself healthy, including exercising regularly, eating a healthy diet, watching your weight, and checking your blood pressure and blood sugar levels.  Get screened for cancer and depression. Make sure that you are up to date with all your vaccines. This information is not intended to replace advice given to you by your health care provider. Make sure you discuss any questions you have with your health care provider. Document Released: 12/19/2005 Document Revised: 10/20/2018 Document Reviewed: 10/20/2018 Elsevier Patient Education  2020 [employer]vier Inc.  Breast Self-Awareness Breast self-awareness is knowing how your breasts look and feel. Doing breast self-awareness is important. It allows you to catch a breast problem early while it is still small and can be treated. All women should do breast self-awareness, including women who have had breast implants. Tell your doctor if you notice a change in your breasts. What you need:  A mirror.  A well-lit room. How to do a breast self-exam A breast self-exam is one way to learn what is normal for your breasts and to check for changes. To do a breast self-exam: Look for changes  1. Take off all the clothes above your waist. 2. Stand in front  of a mirror in a room with good lighting. 3. Put your hands on your hips. 4. Push your hands down. 5. Look at your breasts and nipples in the mirror to see if one breast or nipple looks different from the other. Check to see if: ? The shape of one breast is different. ? The size of one breast is different. ? There are wrinkles, dips, and bumps in one breast and not the other. 6. Look at each breast for changes in the skin, such as: ? Redness. ? Scaly areas. 7. Look for changes in your nipples, such as: ? Liquid around the nipples. ? Bleeding. ? Dimpling. ? Redness. ? A change in where the nipples are. Feel for changes  1. Lie  on your back on the floor. 2. Feel each breast. To do this, follow these steps: ? Pick a breast to feel. ? Put the arm closest to that breast above your head. ? Use your other arm to feel the nipple area of your breast. Feel the area with the pads of your three middle fingers by making small circles with your fingers. For the first circle, press lightly. For the second circle, press harder. For the third circle, press even harder. ? Keep making circles with your fingers at the different pressures as you move down your breast. Stop when you feel your ribs. ? Move your fingers a little toward the center of your body. ? Start making circles with your fingers again, this time going up until you reach your collarbone. ? Keep making up-and-down circles until you reach your armpit. Remember to keep using the three pressures. ? Feel the other breast in the same way. 3. Sit or stand in the tub or shower. 4. With soapy water on your skin, feel each breast the same way you did in step 2 when you were lying on the floor. Write down what you find Writing down what you find can help you remember what to tell your doctor. Write down:  What is normal for each breast.  Any changes you find in each breast, including: ? The kind of changes you find. ? Whether you have pain. ? Size and location of any lumps.  When you last had your menstrual period. General tips  Check your breasts every month.  If you are breastfeeding, the best time to check your breasts is after you feed your baby or after you use a breast pump.  If you get menstrual periods, the best time to check your breasts is 5-7 days after your menstrual period is over.  With time, you will become comfortable with the self-exam, and you will begin to know if there are changes in your breasts. Contact a doctor if you:  See a change in the shape or size of your breasts or nipples.  See a change in the skin of your breast or nipples, such as  red or scaly skin.  Have fluid coming from your nipples that is not normal.  Find a lump or thick area that was not there before.  Have pain in your breasts.  Have any concerns about your breast health. Summary  Breast self-awareness includes looking for changes in your breasts, as well as feeling for changes within your breasts.  Breast self-awareness should be done in front of a mirror in a well-lit room.  You should check your breasts every month. If you get menstrual periods, the best time to check your breasts is 5-7 days after  your menstrual period is over.  Let your doctor know of any changes you see in your breasts, including changes in size, changes on the skin, pain or tenderness, or fluid from your nipples that is not normal. This information is not intended to replace advice given to you by your health care provider. Make sure you discuss any questions you have with your health care provider. Document Released: 04/14/2008 Document Revised: 06/15/2018 Document Reviewed: 06/15/2018 Elsevier Patient Education  2020 [employer]    Breast Tenderness Breast tenderness is a common problem for women of all ages. Breast tenderness may cause mild discomfort to severe pain. The pain usually comes and goes in association with your menstrual cycle, but it can be constant. Breast tenderness has many possible causes, including hormone changes and some medicines. Your health care provider may order tests, such as a mammogram or an ultrasound, to check for any unusual findings. Having breast tenderness usually does not mean that you have breast cancer. Follow these instructions at home: Sometimes, reassurance that you do not have breast cancer is all that is needed. In general, follow these home care instructions: Managing pain and discomfort   If directed, apply ice to the area: ? Put ice in a plastic bag. ? Place a towel between your skin and the bag. ? Leave the ice on for 20  minutes, 2-3 times a day.  Make sure you are wearing a supportive bra, especially during exercise. You may also want to wear a supportive bra while sleeping if your breasts are very tender. Medicines  Take over-the-counter and prescription medicines only as told by your health care provider. If the cause of your pain is infection, you may be prescribed an antibiotic medicine.  If you were prescribed an antibiotic, take it as told by your health care provider. Do not stop taking the antibiotic even if you start to feel better. General instructions   Your health care provider may recommend that you reduce the amount of fat in your diet. You can do this by: ? Limiting fried foods. ? Cooking foods using methods, such as baking, boiling, grilling, and broiling.  Decrease the amount of caffeine in your diet. You can do this by drinking more water and choosing caffeine-free options.  Keep a log of the days and times when your breasts are most tender.  Ask your health care provider how to do breast exams at home. This will help you notice if you have an unusual growth or lump. Contact a health care provider if:  Any part of your breast is hard, red, and hot to the touch. This may be a sign of infection.  You are not breastfeeding and you have fluid, especially blood or pus, coming out of your nipples.  You have a fever.  You have a new or painful lump in your breast that remains after your menstrual period ends.  Your pain does not improve or it gets worse.  Your pain is interfering with your daily activities. This information is not intended to replace advice given to you by your health care provider. Make sure you discuss any questions you have with your health care provider. Document Released: 10/09/2008 Document Revised: 10/09/2017 Document Reviewed: 07/25/2016 Elsevier Patient Education  2020 [employer]

## 2019-06-28 NOTE — Progress Notes (Signed)
Pt is present for annual exam. Pt stated that she is having left breast pain no other issues.

## 2019-07-22 ENCOUNTER — Other Ambulatory Visit: Payer: Self-pay

## 2019-07-22 MED ORDER — ESTRADIOL 0.5 MG PO TABS: 0.5000 mg | ORAL_TABLET | Freq: Every day | ORAL | 3 refills | Status: DC

## 2019-07-26 DIAGNOSIS — E039 Hypothyroidism, unspecified: Secondary | ICD-10-CM | POA: Diagnosis not present

## 2019-07-26 DIAGNOSIS — E785 Hyperlipidemia, unspecified: Secondary | ICD-10-CM | POA: Diagnosis not present

## 2019-07-27 DIAGNOSIS — Z0000 Encounter for general adult medical examination without abnormal findings: Secondary | ICD-10-CM | POA: Diagnosis not present

## 2019-07-27 DIAGNOSIS — E039 Hypothyroidism, unspecified: Secondary | ICD-10-CM | POA: Diagnosis not present

## 2019-07-27 DIAGNOSIS — E785 Hyperlipidemia, unspecified: Secondary | ICD-10-CM | POA: Diagnosis not present

## 2019-09-16 ENCOUNTER — Ambulatory Visit
Admission: RE | Admit: 2019-09-16 | Discharge: 2019-09-16 | Disposition: A | Discharge status: Home / Self Care | Payer: PPO | Source: Ambulatory Visit | Attending: Obstetrics and Gynecology | Admitting: Obstetrics and Gynecology

## 2019-09-16 DIAGNOSIS — Z1239 Encounter for other screening for malignant neoplasm of breast: Secondary | ICD-10-CM

## 2019-09-16 DIAGNOSIS — Z1231 Encounter for screening mammogram for malignant neoplasm of breast: Secondary | ICD-10-CM | POA: Insufficient documentation

## 2019-09-19 DIAGNOSIS — H2513 Age-related nuclear cataract, bilateral: Secondary | ICD-10-CM | POA: Diagnosis not present

## 2019-10-11 DIAGNOSIS — U071 COVID-19: Secondary | ICD-10-CM

## 2019-10-11 HISTORY — DX: COVID-19: U07.1

## 2019-12-06 DIAGNOSIS — J309 Allergic rhinitis, unspecified: Secondary | ICD-10-CM | POA: Diagnosis not present

## 2020-04-17 DIAGNOSIS — H2513 Age-related nuclear cataract, bilateral: Secondary | ICD-10-CM | POA: Diagnosis not present

## 2020-05-07 DIAGNOSIS — J3089 Other allergic rhinitis: Secondary | ICD-10-CM | POA: Diagnosis not present

## 2020-05-07 DIAGNOSIS — H2512 Age-related nuclear cataract, left eye: Secondary | ICD-10-CM | POA: Diagnosis not present

## 2020-05-10 ENCOUNTER — Other Ambulatory Visit: Payer: Self-pay

## 2020-05-10 ENCOUNTER — Encounter: Payer: Self-pay | Admitting: Ophthalmology

## 2020-05-10 NOTE — Discharge Instructions (Signed)

## 2020-05-15 NOTE — Anesthesia Preprocedure Evaluation (Addendum)
Anesthesia Evaluation  Patient identified by MRN, date of birth, ID band Patient awake    Reviewed: Allergy & Precautions, NPO status , Patient's Chart, lab work & pertinent test results, reviewed documented beta blocker date and time   History of Anesthesia Complications Negative for: history of anesthetic complications  Airway Mallampati: II  TM Distance: >3 FB Neck ROM: Full    Dental   Pulmonary   COVID+ 10/2019   breath sounds clear to auscultation       Cardiovascular (-) angina(-) DOE  Rhythm:Regular Rate:Normal     Neuro/Psych PSYCHIATRIC DISORDERS Anxiety Depression    GI/Hepatic neg GERD  , Diverticulosis   Endo/Other  Hypothyroidism   Renal/GU      Musculoskeletal   Abdominal   Peds  Hematology   Anesthesia Other Findings   Reproductive/Obstetrics                            Anesthesia Physical Anesthesia Plan  ASA: II  Anesthesia Plan: MAC   Post-op Pain Management:    Induction: Intravenous  PONV Risk Score and Plan: 2 and TIVA, Midazolam and Treatment may vary due to age or medical condition  Airway Management Planned: Nasal Cannula  Additional Equipment:   Intra-op Plan:   Post-operative Plan:   Informed Consent: I have reviewed the patients History and Physical, chart, labs and discussed the procedure including the risks, benefits and alternatives for the proposed anesthesia with the patient or authorized representative who has indicated his/her understanding and acceptance.       Plan Discussed with: CRNA and Anesthesiologist  Anesthesia Plan Comments:         Anesthesia Quick Evaluation

## 2020-05-16 ENCOUNTER — Ambulatory Visit
Admission: RE | Admit: 2020-05-16 | Discharge: 2020-05-16 | Disposition: A | Discharge status: Home / Self Care | Payer: PPO | Attending: Ophthalmology | Admitting: Ophthalmology

## 2020-05-16 ENCOUNTER — Ambulatory Visit: Payer: PPO | Admitting: Anesthesiology

## 2020-05-16 ENCOUNTER — Encounter
Admission: RE | Disposition: A | Discharge status: Home / Self Care | Payer: Self-pay | Source: Home / Self Care | Attending: Ophthalmology

## 2020-05-16 ENCOUNTER — Other Ambulatory Visit: Payer: Self-pay

## 2020-05-16 ENCOUNTER — Encounter: Payer: Self-pay | Admitting: Ophthalmology

## 2020-05-16 DIAGNOSIS — Z7982 Long term (current) use of aspirin: Secondary | ICD-10-CM | POA: Insufficient documentation

## 2020-05-16 DIAGNOSIS — E039 Hypothyroidism, unspecified: Secondary | ICD-10-CM | POA: Diagnosis not present

## 2020-05-16 DIAGNOSIS — H25812 Combined forms of age-related cataract, left eye: Secondary | ICD-10-CM | POA: Diagnosis not present

## 2020-05-16 DIAGNOSIS — Z79890 Hormone replacement therapy (postmenopausal): Secondary | ICD-10-CM | POA: Diagnosis not present

## 2020-05-16 DIAGNOSIS — Z8616 Personal history of COVID-19: Secondary | ICD-10-CM | POA: Insufficient documentation

## 2020-05-16 DIAGNOSIS — Z79899 Other long term (current) drug therapy: Secondary | ICD-10-CM | POA: Diagnosis not present

## 2020-05-16 DIAGNOSIS — H2512 Age-related nuclear cataract, left eye: Secondary | ICD-10-CM | POA: Insufficient documentation

## 2020-05-16 HISTORY — PX: CATARACT EXTRACTION W/PHACO: SHX586

## 2020-05-16 HISTORY — DX: Dizziness and giddiness: R42

## 2020-05-16 SURGERY — PHACOEMULSIFICATION, CATARACT, WITH IOL INSERTION
Anesthesia: Monitor Anesthesia Care | Site: Eye | Laterality: Left

## 2020-05-16 MED ORDER — NA HYALUR & NA CHOND-NA HYALUR 0.4-0.35 ML IO KIT
PACK | INTRAOCULAR | Status: DC | PRN
  Administered 2020-05-16: 1 mL via INTRAOCULAR

## 2020-05-16 MED ORDER — MIDAZOLAM HCL 2 MG/2ML IJ SOLN
INTRAMUSCULAR | Status: DC | PRN
  Administered 2020-05-16 (×2): 1 mg via INTRAVENOUS

## 2020-05-16 MED ORDER — LACTATED RINGERS IV SOLN: INTRAVENOUS | Status: DC

## 2020-05-16 MED ORDER — BRIMONIDINE TARTRATE-TIMOLOL 0.2-0.5 % OP SOLN
OPHTHALMIC | Status: DC | PRN
  Administered 2020-05-16: 1 [drp] via OPHTHALMIC

## 2020-05-16 MED ORDER — EPINEPHRINE PF 1 MG/ML IJ SOLN
INTRAOCULAR | Status: DC | PRN
  Administered 2020-05-16: 65 mL via OPHTHALMIC

## 2020-05-16 MED ORDER — ACETAMINOPHEN 10 MG/ML IV SOLN: 1000.0000 mg | Freq: Once | INTRAVENOUS | Status: DC | PRN

## 2020-05-16 MED ORDER — ONDANSETRON HCL 4 MG/2ML IJ SOLN: 4.0000 mg | Freq: Once | INTRAMUSCULAR | Status: DC | PRN

## 2020-05-16 MED ORDER — CEFUROXIME OPHTHALMIC INJECTION 1 MG/0.1 ML
INJECTION | OPHTHALMIC | Status: DC | PRN
  Administered 2020-05-16: 0.1 mL via INTRACAMERAL

## 2020-05-16 MED ORDER — LIDOCAINE HCL (PF) 2 % IJ SOLN
INTRAOCULAR | Status: DC | PRN
  Administered 2020-05-16: 1 mL

## 2020-05-16 MED ORDER — ARMC OPHTHALMIC DILATING DROPS
1.0000 "application " | OPHTHALMIC | Status: DC | PRN
  Administered 2020-05-16 (×3): 1 via OPHTHALMIC

## 2020-05-16 MED ORDER — TETRACAINE HCL 0.5 % OP SOLN
1.0000 [drp] | OPHTHALMIC | Status: DC | PRN
  Administered 2020-05-16 (×3): 1 [drp] via OPHTHALMIC

## 2020-05-16 MED ORDER — FENTANYL CITRATE (PF) 100 MCG/2ML IJ SOLN
INTRAMUSCULAR | Status: DC | PRN
  Administered 2020-05-16 (×2): 50 ug via INTRAVENOUS

## 2020-05-16 MED ORDER — MOXIFLOXACIN HCL 0.5 % OP SOLN
1.0000 [drp] | OPHTHALMIC | Status: DC | PRN
  Administered 2020-05-16 (×3): 1 [drp] via OPHTHALMIC

## 2020-05-16 SURGICAL SUPPLY — 30 items
CANNULA ANT/CHMB 27G (MISCELLANEOUS) ×1 IMPLANT
CANNULA ANT/CHMB 27GA (MISCELLANEOUS) ×3 IMPLANT
GLOVE SURG LX 7.5 STRW (GLOVE) ×4
GLOVE SURG LX STRL 7.5 STRW (GLOVE) ×1 IMPLANT
GLOVE SURG TRIUMPH 8.0 PF LTX (GLOVE) ×3 IMPLANT
GOWN STRL REUS W/ TWL LRG LVL3 (GOWN DISPOSABLE) ×2 IMPLANT
GOWN STRL REUS W/TWL LRG LVL3 (GOWN DISPOSABLE) ×4
LENS IOL EYHANCE TORIC II 23.0 ×3 IMPLANT
LENS IOL EYHANCE TRC 225 23.0 IMPLANT
LENS IOL EYHNC TORIC 225 23.0 ×1 IMPLANT
MARKER SKIN DUAL TIP RULER LAB (MISCELLANEOUS) ×3 IMPLANT
NDL CAPSULORHEX 25GA (NEEDLE) ×1 IMPLANT
NDL FILTER BLUNT 18X1 1/2 (NEEDLE) ×2 IMPLANT
NDL RETROBULBAR .5 NSTRL (NEEDLE) IMPLANT
NEEDLE CAPSULORHEX 25GA (NEEDLE) ×3 IMPLANT
NEEDLE FILTER BLUNT 18X 1/2SAF (NEEDLE) ×4
NEEDLE FILTER BLUNT 18X1 1/2 (NEEDLE) ×2 IMPLANT
PACK CATARACT BRASINGTON (MISCELLANEOUS) ×3 IMPLANT
PACK EYE AFTER SURG (MISCELLANEOUS) ×3 IMPLANT
PACK OPTHALMIC (MISCELLANEOUS) ×3 IMPLANT
RING MALYGIN 7.0 (MISCELLANEOUS) IMPLANT
SOLUTION OPHTHALMIC SALT (MISCELLANEOUS) ×3 IMPLANT
SUT ETHILON 10-0 CS-B-6CS-B-6 (SUTURE)
SUT VICRYL  9 0 (SUTURE)
SUT VICRYL 9 0 (SUTURE) IMPLANT
SUTURE EHLN 10-0 CS-B-6CS-B-6 (SUTURE) IMPLANT
SYR 3ML LL SCALE MARK (SYRINGE) ×6 IMPLANT
SYR TB 1ML LUER SLIP (SYRINGE) ×3 IMPLANT
WATER STERILE IRR 250ML POUR (IV SOLUTION) ×3 IMPLANT
WIPE NON LINTING 3.25X3.25 (MISCELLANEOUS) ×3 IMPLANT

## 2020-05-16 NOTE — Op Note (Signed)
LOCATION:  [facility]   PREOPERATIVE DIAGNOSIS:  Nuclear sclerotic cataract of the left eye.  H25.12  POSTOPERATIVE DIAGNOSIS:  Nuclear sclerotic cataract of the left eye.   PROCEDURE:  Phacoemulsification with Toric posterior chamber intraocular lens placement of the left eye.  Ultrasound time:  Procedure(s) with comments: CATARACT EXTRACTION PHACO AND INTRAOCULAR LENS PLACEMENT (IOC) LEFT EYHANCE TORIC (Left) - 7.50 1:06.5 11.3% LENS: [id number] 23.0 D Eyhance Toric intraocular lens with 2.25 diopters of cylindrical power with axis orientation at 108 degrees.   SURGEON:  Deirdre Evener, MD   ANESTHESIA:  Topical with tetracaine drops and 2% Xylocaine jelly, augmented with 1% preservative-free intracameral lidocaine.  COMPLICATIONS:  None.   DESCRIPTION OF PROCEDURE:  The patient was identified in the holding room and transported to the operating suite and placed in the supine position under the operating microscope.  The left eye was identified as the operative eye, and it was prepped and draped in the usual sterile ophthalmic fashion.    A clear-corneal paracentesis incision was made at the 1:30 position.  0.5 ml of preservative-free 1% lidocaine was injected into the anterior chamber. The anterior chamber was filled with Viscoat.  A 2.4 millimeter near clear corneal incision was then made at the 10:30 position.  A cystotome and capsulorrhexis forceps were then used to make a curvilinear capsulorrhexis.  Hydrodissection and hydrodelineation were then performed using balanced salt solution.   Phacoemulsification was then used in stop and chop fashion to remove the lens, nucleus and epinucleus.  The remaining cortex was aspirated using the irrigation and aspiration handpiece.  Provisc viscoelastic was then placed into the capsular bag to distend it for lens placement.  The Verion digital marker was used to align the implant at the intended axis.   A 23.0 diopter lens was  then injected into the capsular bag.  It was rotated clockwise until the axis marks on the lens were approximately 15 degrees in the counterclockwise direction to the intended alignment.  The viscoelastic was aspirated from the eye using the irrigation aspiration handpiece.  Then, a Koch spatula through the sideport incision was used to rotate the lens in a clockwise direction until the axis markings of the intraocular lens were lined up with the Verion alignment.  Balanced salt solution was then used to hydrate the wounds. Cefuroxime 0.1 ml of a 10mg /ml solution was injected into the anterior chamber for a dose of 1 mg of intracameral antibiotic at the completion of the case.    The eye was noted to have a physiologic pressure and there was no wound leak noted.   Timolol and Brimonidine drops were applied to the eye.  The patient was taken to the recovery room in stable condition having had no complications of anesthesia or surgery.  Tannar Broker 05/16/2020, 12:55 PM

## 2020-05-16 NOTE — Anesthesia Procedure Notes (Signed)
Procedure Name: MAC Date/Time: 05/16/2020 12:33 PM Performed by: Vanetta Shawl, CRNA Pre-anesthesia Checklist: Patient identified, Emergency Drugs available, Suction available, Timeout performed and Patient being monitored Patient Re-evaluated:Patient Re-evaluated prior to induction Oxygen Delivery Method: Nasal cannula Placement Confirmation: positive ETCO2

## 2020-05-16 NOTE — Anesthesia Postprocedure Evaluation (Signed)
Anesthesia Post Note  Patient: Gwendolyn Holmes  Procedure(s) Performed: CATARACT EXTRACTION PHACO AND INTRAOCULAR LENS PLACEMENT (IOC) LEFT EYHANCE TORIC (Left Eye)     Patient location during evaluation: PACU Anesthesia Type: MAC Level of consciousness: awake and alert Pain management: pain level controlled Vital Signs Assessment: post-procedure vital signs reviewed and stable Respiratory status: spontaneous breathing, nonlabored ventilation, respiratory function stable and patient connected to nasal cannula oxygen Cardiovascular status: stable and blood pressure returned to baseline Postop Assessment: no apparent nausea or vomiting Anesthetic complications: no   No complications documented.  Armonie Staten A  Marciel Offenberger

## 2020-05-16 NOTE — Transfer of Care (Signed)
Immediate Anesthesia Transfer of Care Note  Patient: Gwendolyn Holmes  Procedure(s) Performed: CATARACT EXTRACTION PHACO AND INTRAOCULAR LENS PLACEMENT (IOC) LEFT EYHANCE TORIC (Left Eye)  Patient Location: PACU  Anesthesia Type: MAC  Level of Consciousness: awake, alert  and patient cooperative  Airway and Oxygen Therapy: Patient Spontanous Breathing   Post-op Assessment: Post-op Vital signs reviewed, Patient's Cardiovascular Status Stable, Respiratory Function Stable, Patent Airway and No signs of Nausea or vomiting  Post-op Vital Signs: Reviewed and stable  Complications: No complications documented.

## 2020-05-16 NOTE — H&P (Signed)

## 2020-05-17 ENCOUNTER — Encounter: Payer: Self-pay | Admitting: Ophthalmology

## 2020-05-25 DIAGNOSIS — J45909 Unspecified asthma, uncomplicated: Secondary | ICD-10-CM | POA: Diagnosis not present

## 2020-05-25 DIAGNOSIS — H2511 Age-related nuclear cataract, right eye: Secondary | ICD-10-CM | POA: Diagnosis not present

## 2020-05-29 ENCOUNTER — Encounter: Payer: Self-pay | Admitting: Ophthalmology

## 2020-06-04 ENCOUNTER — Other Ambulatory Visit: Payer: Self-pay

## 2020-06-04 ENCOUNTER — Other Ambulatory Visit
Admission: RE | Admit: 2020-06-04 | Discharge: 2020-06-04 | Disposition: A | Discharge status: Home / Self Care | Payer: PPO | Source: Ambulatory Visit | Attending: Ophthalmology | Admitting: Ophthalmology

## 2020-06-04 DIAGNOSIS — Z01812 Encounter for preprocedural laboratory examination: Secondary | ICD-10-CM | POA: Diagnosis not present

## 2020-06-04 DIAGNOSIS — Z20822 Contact with and (suspected) exposure to covid-19: Secondary | ICD-10-CM | POA: Diagnosis not present

## 2020-06-04 NOTE — Discharge Instructions (Signed)

## 2020-06-05 LAB — SARS CORONAVIRUS 2 (TAT 6-24 HRS): SARS Coronavirus 2: NEGATIVE

## 2020-06-06 ENCOUNTER — Ambulatory Visit: Payer: PPO | Admitting: Anesthesiology

## 2020-06-06 ENCOUNTER — Ambulatory Visit
Admission: RE | Admit: 2020-06-06 | Discharge: 2020-06-06 | Disposition: A | Discharge status: Home / Self Care | Payer: PPO | Attending: Ophthalmology | Admitting: Ophthalmology

## 2020-06-06 ENCOUNTER — Other Ambulatory Visit: Payer: Self-pay

## 2020-06-06 ENCOUNTER — Encounter
Admission: RE | Disposition: A | Discharge status: Home / Self Care | Payer: Self-pay | Source: Home / Self Care | Attending: Ophthalmology

## 2020-06-06 ENCOUNTER — Encounter: Payer: Self-pay | Admitting: Ophthalmology

## 2020-06-06 DIAGNOSIS — Z79899 Other long term (current) drug therapy: Secondary | ICD-10-CM | POA: Diagnosis not present

## 2020-06-06 DIAGNOSIS — Z9842 Cataract extraction status, left eye: Secondary | ICD-10-CM | POA: Insufficient documentation

## 2020-06-06 DIAGNOSIS — Z8616 Personal history of COVID-19: Secondary | ICD-10-CM | POA: Insufficient documentation

## 2020-06-06 DIAGNOSIS — Z79890 Hormone replacement therapy (postmenopausal): Secondary | ICD-10-CM | POA: Diagnosis not present

## 2020-06-06 DIAGNOSIS — E039 Hypothyroidism, unspecified: Secondary | ICD-10-CM | POA: Insufficient documentation

## 2020-06-06 DIAGNOSIS — Z7982 Long term (current) use of aspirin: Secondary | ICD-10-CM | POA: Insufficient documentation

## 2020-06-06 DIAGNOSIS — Z91040 Latex allergy status: Secondary | ICD-10-CM | POA: Diagnosis not present

## 2020-06-06 DIAGNOSIS — H2511 Age-related nuclear cataract, right eye: Secondary | ICD-10-CM | POA: Insufficient documentation

## 2020-06-06 DIAGNOSIS — H25811 Combined forms of age-related cataract, right eye: Secondary | ICD-10-CM | POA: Diagnosis not present

## 2020-06-06 DIAGNOSIS — J45909 Unspecified asthma, uncomplicated: Secondary | ICD-10-CM | POA: Diagnosis not present

## 2020-06-06 HISTORY — PX: CATARACT EXTRACTION W/PHACO: SHX586

## 2020-06-06 SURGERY — PHACOEMULSIFICATION, CATARACT, WITH IOL INSERTION
Anesthesia: Monitor Anesthesia Care | Site: Eye | Laterality: Right

## 2020-06-06 MED ORDER — ACETAMINOPHEN 160 MG/5ML PO SOLN: 325.0000 mg | ORAL | Status: DC | PRN

## 2020-06-06 MED ORDER — LACTATED RINGERS IV SOLN: INTRAVENOUS | Status: DC

## 2020-06-06 MED ORDER — MIDAZOLAM HCL 2 MG/2ML IJ SOLN
INTRAMUSCULAR | Status: DC | PRN
  Administered 2020-06-06: 2 mg via INTRAVENOUS

## 2020-06-06 MED ORDER — LIDOCAINE HCL (PF) 2 % IJ SOLN
INTRAOCULAR | Status: DC | PRN
  Administered 2020-06-06: 1 mL

## 2020-06-06 MED ORDER — TETRACAINE HCL 0.5 % OP SOLN
1.0000 [drp] | OPHTHALMIC | Status: DC | PRN
  Administered 2020-06-06 (×3): 1 [drp] via OPHTHALMIC

## 2020-06-06 MED ORDER — MOXIFLOXACIN HCL 0.5 % OP SOLN
1.0000 [drp] | OPHTHALMIC | Status: DC | PRN
  Administered 2020-06-06 (×3): 1 [drp] via OPHTHALMIC

## 2020-06-06 MED ORDER — ARMC OPHTHALMIC DILATING DROPS
1.0000 "application " | OPHTHALMIC | Status: DC | PRN
  Administered 2020-06-06 (×3): 1 via OPHTHALMIC

## 2020-06-06 MED ORDER — FENTANYL CITRATE (PF) 100 MCG/2ML IJ SOLN
INTRAMUSCULAR | Status: DC | PRN
  Administered 2020-06-06 (×2): 50 ug via INTRAVENOUS

## 2020-06-06 MED ORDER — ONDANSETRON HCL 4 MG/2ML IJ SOLN: 4.0000 mg | Freq: Once | INTRAMUSCULAR | Status: DC | PRN

## 2020-06-06 MED ORDER — EPINEPHRINE PF 1 MG/ML IJ SOLN
INTRAOCULAR | Status: DC | PRN
  Administered 2020-06-06: 51 mL via OPHTHALMIC

## 2020-06-06 MED ORDER — NA HYALUR & NA CHOND-NA HYALUR 0.4-0.35 ML IO KIT
PACK | INTRAOCULAR | Status: DC | PRN
  Administered 2020-06-06: 1 mL via INTRAOCULAR

## 2020-06-06 MED ORDER — CEFUROXIME OPHTHALMIC INJECTION 1 MG/0.1 ML
INJECTION | OPHTHALMIC | Status: DC | PRN
  Administered 2020-06-06: 0.1 mL via INTRACAMERAL

## 2020-06-06 MED ORDER — BRIMONIDINE TARTRATE-TIMOLOL 0.2-0.5 % OP SOLN
OPHTHALMIC | Status: DC | PRN
  Administered 2020-06-06: 1 [drp] via OPHTHALMIC

## 2020-06-06 MED ORDER — ACETAMINOPHEN 325 MG PO TABS: 325.0000 mg | ORAL_TABLET | ORAL | Status: DC | PRN

## 2020-06-06 SURGICAL SUPPLY — 29 items
CANNULA ANT/CHMB 27G (MISCELLANEOUS) ×1 IMPLANT
CANNULA ANT/CHMB 27GA (MISCELLANEOUS) ×3 IMPLANT
GLOVE BIOGEL PI IND STRL 7.5 (GLOVE) IMPLANT
GLOVE BIOGEL PI INDICATOR 7.5 (GLOVE) ×6
GOWN STRL REUS W/ TWL LRG LVL3 (GOWN DISPOSABLE) ×2 IMPLANT
GOWN STRL REUS W/TWL LRG LVL3 (GOWN DISPOSABLE) ×6
LENS IOL EYHANCE TORIC II 23.0 ×3 IMPLANT
LENS IOL EYHANCE TRC 300 23.0 IMPLANT
LENS IOL EYHNC TORIC 300 23.0 ×1 IMPLANT
MARKER SKIN DUAL TIP RULER LAB (MISCELLANEOUS) ×3 IMPLANT
NDL CAPSULORHEX 25GA (NEEDLE) ×1 IMPLANT
NDL FILTER BLUNT 18X1 1/2 (NEEDLE) ×2 IMPLANT
NDL RETROBULBAR .5 NSTRL (NEEDLE) IMPLANT
NEEDLE CAPSULORHEX 25GA (NEEDLE) ×3 IMPLANT
NEEDLE FILTER BLUNT 18X 1/2SAF (NEEDLE) ×4
NEEDLE FILTER BLUNT 18X1 1/2 (NEEDLE) ×2 IMPLANT
PACK CATARACT BRASINGTON (MISCELLANEOUS) ×3 IMPLANT
PACK EYE AFTER SURG (MISCELLANEOUS) ×3 IMPLANT
PACK OPTHALMIC (MISCELLANEOUS) ×3 IMPLANT
RING MALYGIN 7.0 (MISCELLANEOUS) IMPLANT
SOLUTION OPHTHALMIC SALT (MISCELLANEOUS) ×3 IMPLANT
SUT ETHILON 10-0 CS-B-6CS-B-6 (SUTURE)
SUT VICRYL  9 0 (SUTURE)
SUT VICRYL 9 0 (SUTURE) IMPLANT
SUTURE EHLN 10-0 CS-B-6CS-B-6 (SUTURE) IMPLANT
SYR 3ML LL SCALE MARK (SYRINGE) ×6 IMPLANT
SYR TB 1ML LUER SLIP (SYRINGE) ×3 IMPLANT
WATER STERILE IRR 250ML POUR (IV SOLUTION) ×3 IMPLANT
WIPE NON LINTING 3.25X3.25 (MISCELLANEOUS) ×3 IMPLANT

## 2020-06-06 NOTE — Anesthesia Postprocedure Evaluation (Signed)
Anesthesia Post Note  Patient: Gwendolyn Holmes  Procedure(s) Performed: CATARACT EXTRACTION PHACO AND INTRAOCULAR LENS PLACEMENT (IOC) RIGHT EYHANCE TORIC (Right Eye)     Patient location during evaluation: PACU Anesthesia Type: MAC Level of consciousness: awake and alert Pain management: pain level controlled Vital Signs Assessment: post-procedure vital signs reviewed and stable Respiratory status: spontaneous breathing, nonlabored ventilation, respiratory function stable and patient connected to nasal cannula oxygen Cardiovascular status: stable and blood pressure returned to baseline Postop Assessment: no apparent nausea or vomiting Anesthetic complications: no   No complications documented.  Alisa Graff

## 2020-06-06 NOTE — Anesthesia Procedure Notes (Signed)
Procedure Name: MAC Date/Time: 06/06/2020 12:48 PM Performed by: Jeannene Patella, CRNA Pre-anesthesia Checklist: Patient identified, Emergency Drugs available, Suction available, Timeout performed and Patient being monitored Patient Re-evaluated:Patient Re-evaluated prior to induction Oxygen Delivery Method: Nasal cannula Placement Confirmation: positive ETCO2

## 2020-06-06 NOTE — H&P (Signed)

## 2020-06-06 NOTE — Anesthesia Preprocedure Evaluation (Signed)
Anesthesia Evaluation  Patient identified by MRN, date of birth, ID band Patient awake    Reviewed: Allergy & Precautions, H&P , NPO status , Patient's Chart, lab work & pertinent test results, reviewed documented beta blocker date and time   Airway Mallampati: II  TM Distance: >3 FB Neck ROM: full    Dental no notable dental hx.    Pulmonary neg pulmonary ROS,    Pulmonary exam normal breath sounds clear to auscultation       Cardiovascular Exercise Tolerance: Good negative cardio ROS   Rhythm:regular Rate:Normal     Neuro/Psych negative neurological ROS  negative psych ROS   GI/Hepatic negative GI ROS, Neg liver ROS,   Endo/Other  Hypothyroidism   Renal/GU negative Renal ROS  negative genitourinary   Musculoskeletal   Abdominal   Peds  Hematology negative hematology ROS (+)   Anesthesia Other Findings   Reproductive/Obstetrics negative OB ROS                             Anesthesia Physical Anesthesia Plan  ASA: II  Anesthesia Plan: MAC   Post-op Pain Management:    Induction:   PONV Risk Score and Plan: 2 and Treatment may vary due to age or medical condition  Airway Management Planned:   Additional Equipment:   Intra-op Plan:   Post-operative Plan:   Informed Consent: I have reviewed the patients History and Physical, chart, labs and discussed the procedure including the risks, benefits and alternatives for the proposed anesthesia with the patient or authorized representative who has indicated his/her understanding and acceptance.     Dental Advisory Given  Plan Discussed with: CRNA  Anesthesia Plan Comments:         Anesthesia Quick Evaluation

## 2020-06-06 NOTE — Transfer of Care (Signed)
Immediate Anesthesia Transfer of Care Note  Patient: Gwendolyn Holmes  Procedure(s) Performed: CATARACT EXTRACTION PHACO AND INTRAOCULAR LENS PLACEMENT (IOC) RIGHT EYHANCE TORIC (Right Eye)  Patient Location: PACU  Anesthesia Type: MAC  Level of Consciousness: awake, alert  and patient cooperative  Airway and Oxygen Therapy: Patient Spontanous Breathing and Patient connected to supplemental oxygen  Post-op Assessment: Post-op Vital signs reviewed, Patient's Cardiovascular Status Stable, Respiratory Function Stable, Patent Airway and No signs of Nausea or vomiting  Post-op Vital Signs: Reviewed and stable  Complications: No complications documented.

## 2020-06-06 NOTE — Op Note (Signed)
LOCATION:  [facility]   PREOPERATIVE DIAGNOSIS:  Nuclear sclerotic cataract of the right eye.  H25.11   POSTOPERATIVE DIAGNOSIS:  Nuclear sclerotic cataract of the right eye.   PROCEDURE:  Phacoemulsification with Toric posterior chamber intraocular lens placement of the right eye.  Ultrasound time: Procedure(s) with comments: CATARACT EXTRACTION PHACO AND INTRAOCULAR LENS PLACEMENT (IOC) RIGHT EYHANCE TORIC (Right) - 8.43 1:06.7 12.6%  LENS:   Implant Name Type Inv. Item Serial No. Manufacturer Lot No. LRB No. Used Action  TECNIS EYHANCE TORIC IOL Intraocular Lens  [id number] [employer]  Right 1 Implanted     [id number] 23.0 D Toric intraocular lens with 3.0 diopters of cylindrical power with axis orientation at 77 degrees.    SURGEON:  Deirdre Evener, MD   ANESTHESIA: Topical with tetracaine drops and 2% Xylocaine jelly, augmented with 1% preservative-free intracameral lidocaine. .   COMPLICATIONS:  None.   DESCRIPTION OF PROCEDURE:  The patient was identified in the holding room and transported to the operating suite and placed in the supine position under the operating microscope.  The right eye was identified as the operative eye, and it was prepped and draped in the usual sterile ophthalmic fashion.    A clear-corneal paracentesis incision was made at the 12:00 position.  0.5 ml of preservative-free 1% lidocaine was injected into the anterior chamber. The anterior chamber was filled with Viscoat.  A 2.4 millimeter near clear corneal incision was then made at the 9:00 position.  A cystotome and capsulorrhexis forceps were then used to make a curvilinear capsulorrhexis.  Hydrodissection and hydrodelineation were then performed using balanced salt solution.   Phacoemulsification was then used in stop and chop fashion to remove the lens, nucleus and epinucleus.  The remaining cortex was aspirated using the irrigation and aspiration handpiece.  Provisc  viscoelastic was then placed into the capsular bag to distend it for lens placement.  The Verion digital marker was used to align the implant at the intended axis.   A Toric lens was then injected into the capsular bag.  It was rotated clockwise until the axis marks on the lens were approximately 15 degrees in the counterclockwise direction to the intended alignment.  The viscoelastic was aspirated from the eye using the irrigation aspiration handpiece.  Then, a Koch spatula through the sideport incision was used to rotate the lens in a clockwise direction until the axis markings of the intraocular lens were lined up with the Verion alignment.  Balanced salt solution was then used to hydrate the wounds. Cefuroxime 0.1 ml of a 10mg /ml solution was injected into the anterior chamber for a dose of 1 mg of intracameral antibiotic at the completion of the case.    The eye was noted to have a physiologic pressure and there was no wound leak noted.   Timolol and Brimonidine drops were applied to the eye.  The patient was taken to the recovery room in stable condition having had no complications of anesthesia or surgery.  Lenise Jr 06/06/2020, 1:03 PM

## 2020-06-07 ENCOUNTER — Encounter: Payer: Self-pay | Admitting: Ophthalmology

## 2020-06-28 ENCOUNTER — Encounter: Payer: PPO | Admitting: Obstetrics and Gynecology

## 2020-07-06 DIAGNOSIS — R490 Dysphonia: Secondary | ICD-10-CM | POA: Diagnosis not present

## 2020-07-06 DIAGNOSIS — J301 Allergic rhinitis due to pollen: Secondary | ICD-10-CM | POA: Diagnosis not present

## 2020-07-06 DIAGNOSIS — J3801 Paralysis of vocal cords and larynx, unilateral: Secondary | ICD-10-CM | POA: Diagnosis not present

## 2020-07-06 DIAGNOSIS — K219 Gastro-esophageal reflux disease without esophagitis: Secondary | ICD-10-CM | POA: Diagnosis not present

## 2020-07-17 ENCOUNTER — Ambulatory Visit (INDEPENDENT_AMBULATORY_CARE_PROVIDER_SITE_OTHER): Payer: PPO | Admitting: Obstetrics and Gynecology

## 2020-07-17 ENCOUNTER — Encounter: Payer: Self-pay | Admitting: Obstetrics and Gynecology

## 2020-07-17 ENCOUNTER — Other Ambulatory Visit: Payer: Self-pay

## 2020-07-17 VITALS — BP 119/69 | HR 59 | Ht 61.0 in | Wt 136.8 lb

## 2020-07-17 DIAGNOSIS — Z79890 Hormone replacement therapy (postmenopausal): Secondary | ICD-10-CM | POA: Diagnosis not present

## 2020-07-17 DIAGNOSIS — Z01419 Encounter for gynecological examination (general) (routine) without abnormal findings: Secondary | ICD-10-CM

## 2020-07-17 DIAGNOSIS — Z8616 Personal history of COVID-19: Secondary | ICD-10-CM | POA: Diagnosis not present

## 2020-07-17 DIAGNOSIS — E039 Hypothyroidism, unspecified: Secondary | ICD-10-CM

## 2020-07-17 DIAGNOSIS — M85859 Other specified disorders of bone density and structure, unspecified thigh: Secondary | ICD-10-CM

## 2020-07-17 DIAGNOSIS — N393 Stress incontinence (female) (male): Secondary | ICD-10-CM | POA: Diagnosis not present

## 2020-07-17 DIAGNOSIS — Z1231 Encounter for screening mammogram for malignant neoplasm of breast: Secondary | ICD-10-CM

## 2020-07-17 HISTORY — DX: Personal history of COVID-19: Z86.16

## 2020-07-17 MED ORDER — ESTRADIOL 0.5 MG PO TABS: 0.5000 mg | ORAL_TABLET | Freq: Every day | ORAL | 3 refills | Status: AC

## 2020-07-17 NOTE — Progress Notes (Signed)
Pt present for annual exam. Pt stated that she was doing well no problems.  
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
## 2020-07-17 NOTE — Patient Instructions (Signed)

## 2020-07-23 DIAGNOSIS — L247 Irritant contact dermatitis due to plants, except food: Secondary | ICD-10-CM | POA: Diagnosis not present

## 2020-08-07 DIAGNOSIS — J301 Allergic rhinitis due to pollen: Secondary | ICD-10-CM | POA: Diagnosis not present

## 2020-08-17 DIAGNOSIS — J301 Allergic rhinitis due to pollen: Secondary | ICD-10-CM | POA: Diagnosis not present

## 2020-08-20 ENCOUNTER — Other Ambulatory Visit: Payer: Self-pay | Admitting: Family Medicine

## 2020-08-20 ENCOUNTER — Other Ambulatory Visit: Payer: Self-pay | Admitting: Obstetrics and Gynecology

## 2020-08-20 DIAGNOSIS — Z1231 Encounter for screening mammogram for malignant neoplasm of breast: Secondary | ICD-10-CM

## 2020-08-20 DIAGNOSIS — J301 Allergic rhinitis due to pollen: Secondary | ICD-10-CM | POA: Diagnosis not present

## 2020-08-23 DIAGNOSIS — J301 Allergic rhinitis due to pollen: Secondary | ICD-10-CM | POA: Diagnosis not present

## 2020-08-27 DIAGNOSIS — J301 Allergic rhinitis due to pollen: Secondary | ICD-10-CM | POA: Diagnosis not present

## 2020-08-30 DIAGNOSIS — J301 Allergic rhinitis due to pollen: Secondary | ICD-10-CM | POA: Diagnosis not present

## 2020-08-31 DIAGNOSIS — J301 Allergic rhinitis due to pollen: Secondary | ICD-10-CM | POA: Diagnosis not present

## 2020-09-03 DIAGNOSIS — J301 Allergic rhinitis due to pollen: Secondary | ICD-10-CM | POA: Diagnosis not present

## 2020-09-06 DIAGNOSIS — J301 Allergic rhinitis due to pollen: Secondary | ICD-10-CM | POA: Diagnosis not present

## 2020-09-10 DIAGNOSIS — J301 Allergic rhinitis due to pollen: Secondary | ICD-10-CM | POA: Diagnosis not present

## 2020-09-12 DIAGNOSIS — E079 Disorder of thyroid, unspecified: Secondary | ICD-10-CM | POA: Diagnosis not present

## 2020-09-12 DIAGNOSIS — R232 Flushing: Secondary | ICD-10-CM | POA: Diagnosis not present

## 2020-09-12 DIAGNOSIS — K219 Gastro-esophageal reflux disease without esophagitis: Secondary | ICD-10-CM | POA: Diagnosis not present

## 2020-09-12 DIAGNOSIS — E785 Hyperlipidemia, unspecified: Secondary | ICD-10-CM | POA: Diagnosis not present

## 2020-09-12 DIAGNOSIS — R635 Abnormal weight gain: Secondary | ICD-10-CM | POA: Diagnosis not present

## 2020-09-12 DIAGNOSIS — J309 Allergic rhinitis, unspecified: Secondary | ICD-10-CM | POA: Diagnosis not present

## 2020-09-13 DIAGNOSIS — J301 Allergic rhinitis due to pollen: Secondary | ICD-10-CM | POA: Diagnosis not present

## 2020-09-17 ENCOUNTER — Other Ambulatory Visit: Payer: Self-pay

## 2020-09-17 ENCOUNTER — Ambulatory Visit
Admission: RE | Admit: 2020-09-17 | Discharge: 2020-09-17 | Disposition: A | Discharge status: Home / Self Care | Payer: PPO | Source: Ambulatory Visit | Attending: Family Medicine | Admitting: Family Medicine

## 2020-09-17 DIAGNOSIS — Z1231 Encounter for screening mammogram for malignant neoplasm of breast: Secondary | ICD-10-CM | POA: Diagnosis not present

## 2020-09-17 DIAGNOSIS — J301 Allergic rhinitis due to pollen: Secondary | ICD-10-CM | POA: Diagnosis not present

## 2020-09-20 DIAGNOSIS — J301 Allergic rhinitis due to pollen: Secondary | ICD-10-CM | POA: Diagnosis not present

## 2020-09-21 DIAGNOSIS — J301 Allergic rhinitis due to pollen: Secondary | ICD-10-CM | POA: Diagnosis not present

## 2020-09-24 DIAGNOSIS — J301 Allergic rhinitis due to pollen: Secondary | ICD-10-CM | POA: Diagnosis not present

## 2020-09-27 DIAGNOSIS — J301 Allergic rhinitis due to pollen: Secondary | ICD-10-CM | POA: Diagnosis not present

## 2020-10-01 DIAGNOSIS — J301 Allergic rhinitis due to pollen: Secondary | ICD-10-CM | POA: Diagnosis not present

## 2020-10-08 DIAGNOSIS — J301 Allergic rhinitis due to pollen: Secondary | ICD-10-CM | POA: Diagnosis not present

## 2020-10-11 DIAGNOSIS — J301 Allergic rhinitis due to pollen: Secondary | ICD-10-CM | POA: Diagnosis not present

## 2020-10-15 DIAGNOSIS — J301 Allergic rhinitis due to pollen: Secondary | ICD-10-CM | POA: Diagnosis not present

## 2020-10-17 DIAGNOSIS — J301 Allergic rhinitis due to pollen: Secondary | ICD-10-CM | POA: Diagnosis not present

## 2020-10-18 DIAGNOSIS — J301 Allergic rhinitis due to pollen: Secondary | ICD-10-CM | POA: Diagnosis not present

## 2020-10-22 DIAGNOSIS — J301 Allergic rhinitis due to pollen: Secondary | ICD-10-CM | POA: Diagnosis not present

## 2020-10-25 DIAGNOSIS — K219 Gastro-esophageal reflux disease without esophagitis: Secondary | ICD-10-CM | POA: Diagnosis not present

## 2020-10-25 DIAGNOSIS — T7840XD Allergy, unspecified, subsequent encounter: Secondary | ICD-10-CM | POA: Diagnosis not present

## 2020-10-25 DIAGNOSIS — J301 Allergic rhinitis due to pollen: Secondary | ICD-10-CM | POA: Diagnosis not present

## 2020-10-25 DIAGNOSIS — L918 Other hypertrophic disorders of the skin: Secondary | ICD-10-CM | POA: Diagnosis not present

## 2020-10-29 DIAGNOSIS — J301 Allergic rhinitis due to pollen: Secondary | ICD-10-CM | POA: Diagnosis not present

## 2020-11-01 DIAGNOSIS — J301 Allergic rhinitis due to pollen: Secondary | ICD-10-CM | POA: Diagnosis not present

## 2020-11-05 DIAGNOSIS — J301 Allergic rhinitis due to pollen: Secondary | ICD-10-CM | POA: Diagnosis not present

## 2020-11-07 DIAGNOSIS — J301 Allergic rhinitis due to pollen: Secondary | ICD-10-CM | POA: Diagnosis not present

## 2020-11-08 DIAGNOSIS — J301 Allergic rhinitis due to pollen: Secondary | ICD-10-CM | POA: Diagnosis not present

## 2020-11-12 DIAGNOSIS — J301 Allergic rhinitis due to pollen: Secondary | ICD-10-CM | POA: Diagnosis not present

## 2020-11-15 DIAGNOSIS — J301 Allergic rhinitis due to pollen: Secondary | ICD-10-CM | POA: Diagnosis not present

## 2020-11-19 DIAGNOSIS — J301 Allergic rhinitis due to pollen: Secondary | ICD-10-CM | POA: Diagnosis not present

## 2020-11-22 DIAGNOSIS — J301 Allergic rhinitis due to pollen: Secondary | ICD-10-CM | POA: Diagnosis not present

## 2020-11-29 DIAGNOSIS — J301 Allergic rhinitis due to pollen: Secondary | ICD-10-CM | POA: Diagnosis not present

## 2020-12-05 DIAGNOSIS — J301 Allergic rhinitis due to pollen: Secondary | ICD-10-CM | POA: Diagnosis not present

## 2020-12-10 DIAGNOSIS — J301 Allergic rhinitis due to pollen: Secondary | ICD-10-CM | POA: Diagnosis not present

## 2020-12-14 DIAGNOSIS — J301 Allergic rhinitis due to pollen: Secondary | ICD-10-CM | POA: Diagnosis not present

## 2020-12-17 DIAGNOSIS — J301 Allergic rhinitis due to pollen: Secondary | ICD-10-CM | POA: Diagnosis not present

## 2020-12-24 DIAGNOSIS — J301 Allergic rhinitis due to pollen: Secondary | ICD-10-CM | POA: Diagnosis not present

## 2020-12-31 DIAGNOSIS — J301 Allergic rhinitis due to pollen: Secondary | ICD-10-CM | POA: Diagnosis not present

## 2021-01-07 DIAGNOSIS — J301 Allergic rhinitis due to pollen: Secondary | ICD-10-CM | POA: Diagnosis not present

## 2021-01-07 DIAGNOSIS — H26492 Other secondary cataract, left eye: Secondary | ICD-10-CM | POA: Diagnosis not present

## 2021-01-14 DIAGNOSIS — J301 Allergic rhinitis due to pollen: Secondary | ICD-10-CM | POA: Diagnosis not present

## 2021-01-21 DIAGNOSIS — J301 Allergic rhinitis due to pollen: Secondary | ICD-10-CM | POA: Diagnosis not present

## 2021-01-28 DIAGNOSIS — J301 Allergic rhinitis due to pollen: Secondary | ICD-10-CM | POA: Diagnosis not present

## 2021-02-04 DIAGNOSIS — J301 Allergic rhinitis due to pollen: Secondary | ICD-10-CM | POA: Diagnosis not present

## 2021-02-11 DIAGNOSIS — J301 Allergic rhinitis due to pollen: Secondary | ICD-10-CM | POA: Diagnosis not present

## 2021-02-18 DIAGNOSIS — J301 Allergic rhinitis due to pollen: Secondary | ICD-10-CM | POA: Diagnosis not present

## 2021-02-25 DIAGNOSIS — J301 Allergic rhinitis due to pollen: Secondary | ICD-10-CM | POA: Diagnosis not present

## 2021-03-04 DIAGNOSIS — J301 Allergic rhinitis due to pollen: Secondary | ICD-10-CM | POA: Diagnosis not present

## 2021-03-05 DIAGNOSIS — J301 Allergic rhinitis due to pollen: Secondary | ICD-10-CM | POA: Diagnosis not present

## 2021-03-11 DIAGNOSIS — J301 Allergic rhinitis due to pollen: Secondary | ICD-10-CM | POA: Diagnosis not present

## 2021-03-18 DIAGNOSIS — J301 Allergic rhinitis due to pollen: Secondary | ICD-10-CM | POA: Diagnosis not present

## 2021-03-25 DIAGNOSIS — J301 Allergic rhinitis due to pollen: Secondary | ICD-10-CM | POA: Diagnosis not present

## 2021-04-01 DIAGNOSIS — J301 Allergic rhinitis due to pollen: Secondary | ICD-10-CM | POA: Diagnosis not present

## 2021-04-15 DIAGNOSIS — J301 Allergic rhinitis due to pollen: Secondary | ICD-10-CM | POA: Diagnosis not present

## 2021-04-22 DIAGNOSIS — J301 Allergic rhinitis due to pollen: Secondary | ICD-10-CM | POA: Diagnosis not present

## 2021-04-29 DIAGNOSIS — J301 Allergic rhinitis due to pollen: Secondary | ICD-10-CM | POA: Diagnosis not present

## 2021-05-06 DIAGNOSIS — J301 Allergic rhinitis due to pollen: Secondary | ICD-10-CM | POA: Diagnosis not present

## 2021-05-20 DIAGNOSIS — J301 Allergic rhinitis due to pollen: Secondary | ICD-10-CM | POA: Diagnosis not present

## 2021-05-27 DIAGNOSIS — J301 Allergic rhinitis due to pollen: Secondary | ICD-10-CM | POA: Diagnosis not present

## 2021-05-31 DIAGNOSIS — J301 Allergic rhinitis due to pollen: Secondary | ICD-10-CM | POA: Diagnosis not present

## 2021-06-03 DIAGNOSIS — J301 Allergic rhinitis due to pollen: Secondary | ICD-10-CM | POA: Diagnosis not present

## 2021-06-10 DIAGNOSIS — J301 Allergic rhinitis due to pollen: Secondary | ICD-10-CM | POA: Diagnosis not present

## 2021-06-11 DIAGNOSIS — M545 Low back pain, unspecified: Secondary | ICD-10-CM | POA: Diagnosis not present

## 2021-06-11 DIAGNOSIS — G2581 Restless legs syndrome: Secondary | ICD-10-CM | POA: Diagnosis not present

## 2021-06-11 DIAGNOSIS — L918 Other hypertrophic disorders of the skin: Secondary | ICD-10-CM | POA: Diagnosis not present

## 2021-06-11 DIAGNOSIS — L247 Irritant contact dermatitis due to plants, except food: Secondary | ICD-10-CM | POA: Diagnosis not present

## 2021-06-11 DIAGNOSIS — E079 Disorder of thyroid, unspecified: Secondary | ICD-10-CM | POA: Diagnosis not present

## 2021-06-11 DIAGNOSIS — E785 Hyperlipidemia, unspecified: Secondary | ICD-10-CM | POA: Diagnosis not present

## 2021-06-17 DIAGNOSIS — J301 Allergic rhinitis due to pollen: Secondary | ICD-10-CM | POA: Diagnosis not present

## 2021-06-24 DIAGNOSIS — J301 Allergic rhinitis due to pollen: Secondary | ICD-10-CM | POA: Diagnosis not present

## 2021-07-01 DIAGNOSIS — J301 Allergic rhinitis due to pollen: Secondary | ICD-10-CM | POA: Diagnosis not present

## 2021-07-08 DIAGNOSIS — J301 Allergic rhinitis due to pollen: Secondary | ICD-10-CM | POA: Diagnosis not present

## 2021-07-11 DIAGNOSIS — J3801 Paralysis of vocal cords and larynx, unilateral: Secondary | ICD-10-CM | POA: Diagnosis not present

## 2021-07-11 DIAGNOSIS — J301 Allergic rhinitis due to pollen: Secondary | ICD-10-CM | POA: Diagnosis not present

## 2021-07-17 NOTE — Patient Instructions (Signed)
Breast Self-Awareness Breast self-awareness is knowing how your breasts look and feel. Doing breast self-awareness is important. It allows you to catch a breast problem early while it is still small and can be treated. All women should do breast self-awareness, including women who have had breast implants. Tell your doctorif you notice a change in your breasts. What you need: A mirror. A well-lit room. How to do a breast self-exam A breast self-exam is one way to learn what is normal for your breasts and tocheck for changes. To do a breast self-exam: Look for changes  Take off all the clothes above your waist. Stand in front of a mirror in a room with good lighting. Put your hands on your hips. Push your hands down. Look at your breasts and nipples in the mirror to see if one breast or nipple looks different from the other. Check to see if: The shape of one breast is different. The size of one breast is different. There are wrinkles, dips, and bumps in one breast and not the other. Look at each breast for changes in the skin, such as: Redness. Scaly areas. Look for changes in your nipples, such as: Liquid around the nipples. Bleeding. Dimpling. Redness. A change in where the nipples are.  Feel for changes  Lie on your back on the floor. Feel each breast. To do this, follow these steps: Pick a breast to feel. Put the arm closest to that breast above your head. Use your other arm to feel the nipple area of your breast. Feel the area with the pads of your three middle fingers by making small circles with your fingers. For the first circle, press lightly. For the second circle, press harder. For the third circle, press even harder. Keep making circles with your fingers at the different pressures as you move down your breast. Stop when you feel your ribs. Move your fingers a little toward the center of your body. Start making circles with your fingers again, this time going up until  you reach your collarbone. Keep making up-and-down circles until you reach your armpit. Remember to keep using the three pressures. Feel the other breast in the same way. Sit or stand in the tub or shower. With soapy water on your skin, feel each breast the same way you did in step 2 when you were lying on the floor.  Write down what you find Writing down what you find can help you remember what to tell your doctor. Write down: What is normal for each breast. Any changes you find in each breast, including: The kind of changes you find. Whether you have pain. Size and location of any lumps. When you last had your menstrual period. General tips Check your breasts every month. If you are breastfeeding, the best time to check your breasts is after you feed your baby or after you use a breast pump. If you get menstrual periods, the best time to check your breasts is 5-7 days after your menstrual period is over. With time, you will become comfortable with the self-exam, and you will begin to know if there are changes in your breasts. Contact a doctor if you: See a change in the shape or size of your breasts or nipples. See a change in the skin of your breast or nipples, such as red or scaly skin. Have fluid coming from your nipples that is not normal. Find a lump or thick area that was not there before. Have pain in   your breasts. Have any concerns about your breast health. Summary Breast self-awareness includes looking for changes in your breasts, as well as feeling for changes within your breasts. Breast self-awareness should be done in front of a mirror in a well-lit room. You should check your breasts every month. If you get menstrual periods, the best time to check your breasts is 5-7 days after your menstrual period is over. Let your doctor know of any changes you see in your breasts, including changes in size, changes on the skin, pain or tenderness, or fluid from your nipples that is  not normal. This information is not intended to replace advice given to you by your health care provider. Make sure you discuss any questions you have with your healthcare provider. Document Revised: 06/15/2018 Document Reviewed: 06/15/2018 Elsevier Patient Education  2022 [employer]  

## 2021-07-18 ENCOUNTER — Encounter: Payer: Self-pay | Admitting: Obstetrics and Gynecology

## 2021-07-18 ENCOUNTER — Ambulatory Visit (INDEPENDENT_AMBULATORY_CARE_PROVIDER_SITE_OTHER): Payer: PPO | Admitting: Obstetrics and Gynecology

## 2021-07-18 ENCOUNTER — Other Ambulatory Visit: Payer: Self-pay

## 2021-07-18 VITALS — BP 106/67 | HR 50 | Ht 61.0 in | Wt 138.3 lb

## 2021-07-18 DIAGNOSIS — E039 Hypothyroidism, unspecified: Secondary | ICD-10-CM | POA: Diagnosis not present

## 2021-07-18 DIAGNOSIS — Z1231 Encounter for screening mammogram for malignant neoplasm of breast: Secondary | ICD-10-CM

## 2021-07-18 DIAGNOSIS — M545 Low back pain, unspecified: Secondary | ICD-10-CM | POA: Diagnosis not present

## 2021-07-18 DIAGNOSIS — N393 Stress incontinence (female) (male): Secondary | ICD-10-CM

## 2021-07-18 DIAGNOSIS — Z01419 Encounter for gynecological examination (general) (routine) without abnormal findings: Secondary | ICD-10-CM | POA: Diagnosis not present

## 2021-07-18 DIAGNOSIS — Z79890 Hormone replacement therapy (postmenopausal): Secondary | ICD-10-CM | POA: Diagnosis not present

## 2021-07-18 DIAGNOSIS — M85859 Other specified disorders of bone density and structure, unspecified thigh: Secondary | ICD-10-CM | POA: Diagnosis not present
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
## 2021-07-22 DIAGNOSIS — J301 Allergic rhinitis due to pollen: Secondary | ICD-10-CM | POA: Diagnosis not present

## 2021-07-29 DIAGNOSIS — J301 Allergic rhinitis due to pollen: Secondary | ICD-10-CM | POA: Diagnosis not present

## 2021-08-05 DIAGNOSIS — J301 Allergic rhinitis due to pollen: Secondary | ICD-10-CM | POA: Diagnosis not present

## 2021-08-12 DIAGNOSIS — J301 Allergic rhinitis due to pollen: Secondary | ICD-10-CM | POA: Diagnosis not present

## 2021-08-19 DIAGNOSIS — J301 Allergic rhinitis due to pollen: Secondary | ICD-10-CM | POA: Diagnosis not present

## 2021-08-26 DIAGNOSIS — J301 Allergic rhinitis due to pollen: Secondary | ICD-10-CM | POA: Diagnosis not present

## 2021-09-01 ENCOUNTER — Encounter: Payer: Self-pay | Admitting: Emergency Medicine

## 2021-09-01 ENCOUNTER — Ambulatory Visit
Admission: EM | Admit: 2021-09-01 | Discharge: 2021-09-01 | Disposition: A | Discharge status: Home / Self Care | Payer: PPO | Attending: Internal Medicine | Admitting: Internal Medicine

## 2021-09-01 ENCOUNTER — Other Ambulatory Visit: Payer: Self-pay

## 2021-09-01 ENCOUNTER — Ambulatory Visit (INDEPENDENT_AMBULATORY_CARE_PROVIDER_SITE_OTHER): Payer: PPO

## 2021-09-01 DIAGNOSIS — J069 Acute upper respiratory infection, unspecified: Secondary | ICD-10-CM

## 2021-09-01 DIAGNOSIS — R053 Chronic cough: Secondary | ICD-10-CM | POA: Diagnosis not present

## 2021-09-01 DIAGNOSIS — R059 Cough, unspecified: Secondary | ICD-10-CM

## 2021-09-01 MED ORDER — PREDNISONE 10 MG (21) PO TBPK: ORAL_TABLET | Freq: Every day | ORAL | 0 refills | Status: DC

## 2021-09-01 MED ORDER — PROMETHAZINE-DM 6.25-15 MG/5ML PO SYRP: 5.0000 mL | ORAL_SOLUTION | Freq: Four times a day (QID) | ORAL | 0 refills | Status: DC | PRN
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
## 2021-09-01 NOTE — Discharge Instructions (Addendum)
Your chest x-ray was normal.  You have been prescribed prednisone steroid taper to decrease inflammation in your chest and help alleviate cough.  Also been prescribed a cough medication to help with this.  Please be advised that this cough medication can cause drowsiness.

## 2021-09-01 NOTE — ED Triage Notes (Signed)
Nasal congestion, cough, occasional shortness of breath x 3 weeks. Non-tender to frontal/maxillary sinus pressure on palpation. Has been using a netty pot with distilled water, with minor improvement. Denies fever, nausea, vomiting, previous covid/flu tests.

## 2021-09-12 DIAGNOSIS — J301 Allergic rhinitis due to pollen: Secondary | ICD-10-CM | POA: Diagnosis not present

## 2021-09-12 DIAGNOSIS — R059 Cough, unspecified: Secondary | ICD-10-CM | POA: Diagnosis not present

## 2021-09-12 DIAGNOSIS — J3089 Other allergic rhinitis: Secondary | ICD-10-CM | POA: Diagnosis not present

## 2021-09-23 ENCOUNTER — Other Ambulatory Visit: Payer: Self-pay

## 2021-09-23 ENCOUNTER — Ambulatory Visit
Admission: RE | Admit: 2021-09-23 | Discharge: 2021-09-23 | Disposition: A | Discharge status: Home / Self Care | Payer: PPO | Source: Ambulatory Visit | Attending: Obstetrics and Gynecology | Admitting: Obstetrics and Gynecology

## 2021-09-23 DIAGNOSIS — Z1231 Encounter for screening mammogram for malignant neoplasm of breast: Secondary | ICD-10-CM | POA: Insufficient documentation

## 2021-10-10 ENCOUNTER — Ambulatory Visit: Payer: Self-pay | Admitting: Allergy

## 2021-10-14 DIAGNOSIS — J309 Allergic rhinitis, unspecified: Secondary | ICD-10-CM | POA: Diagnosis not present

## 2021-10-14 DIAGNOSIS — E785 Hyperlipidemia, unspecified: Secondary | ICD-10-CM | POA: Diagnosis not present

## 2021-10-14 DIAGNOSIS — R232 Flushing: Secondary | ICD-10-CM | POA: Diagnosis not present

## 2021-10-14 DIAGNOSIS — E079 Disorder of thyroid, unspecified: Secondary | ICD-10-CM | POA: Diagnosis not present

## 2021-10-14 DIAGNOSIS — Z0000 Encounter for general adult medical examination without abnormal findings: Secondary | ICD-10-CM | POA: Diagnosis not present

## 2021-10-30 IMAGING — MG DIGITAL SCREENING BREAST BILAT IMPLANT W/ TOMO W/ CAD
8 of 12 series · 8 of 28 positions shown · non-contrast
Comparison: Previous exam(s).

CLINICAL DATA: Screening.

EXAM:
DIGITAL SCREENING BILATERAL MAMMOGRAM WITH IMPLANTS, CAD AND TOMO
The patient has retropectoral implants. Standard and implant
displaced views were performed.

[L MLO]
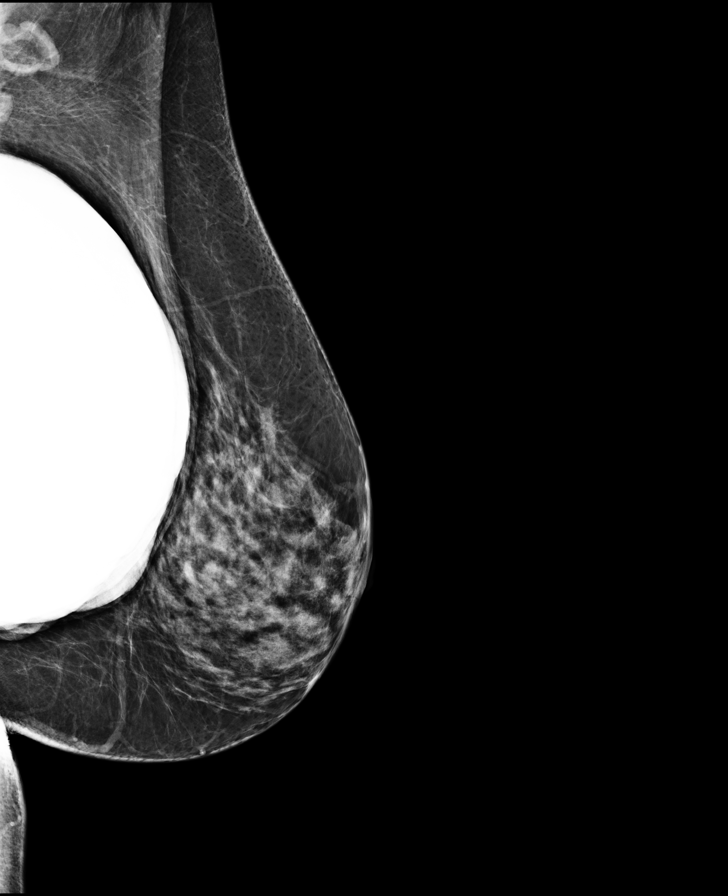

[R CC]
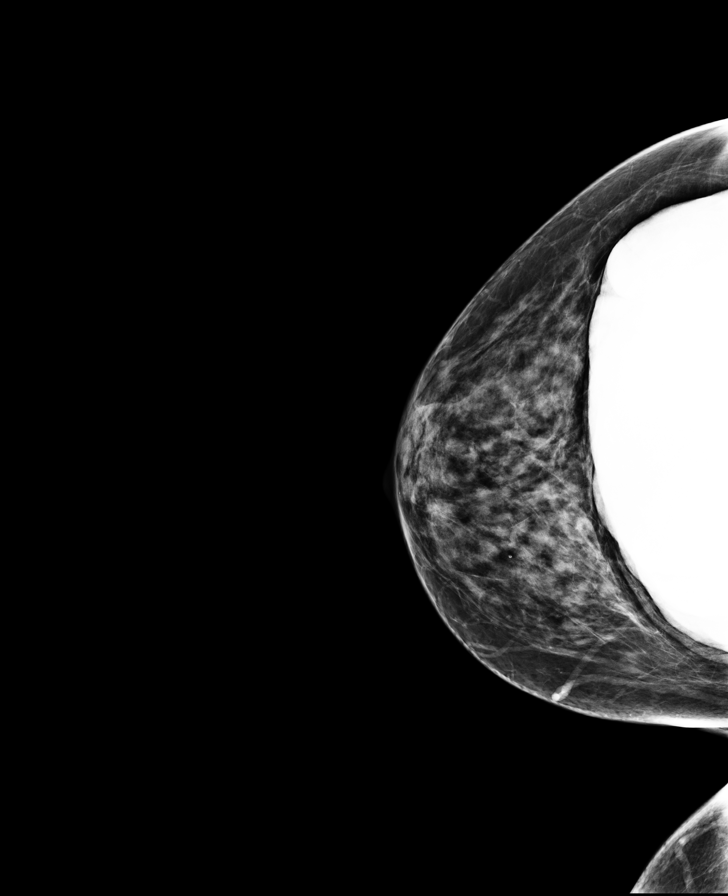

[L CC]
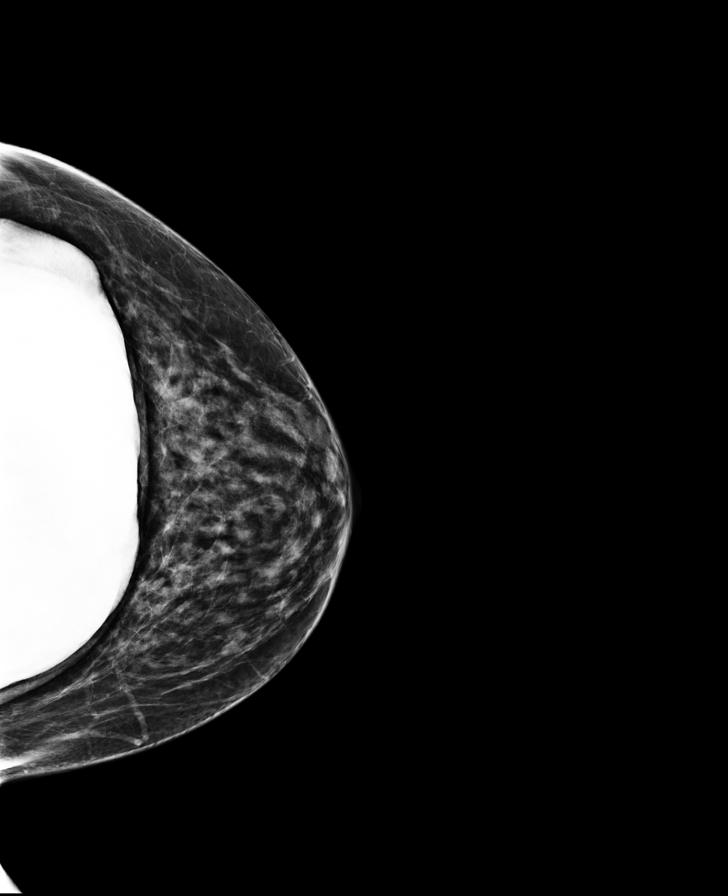

[R MLO]
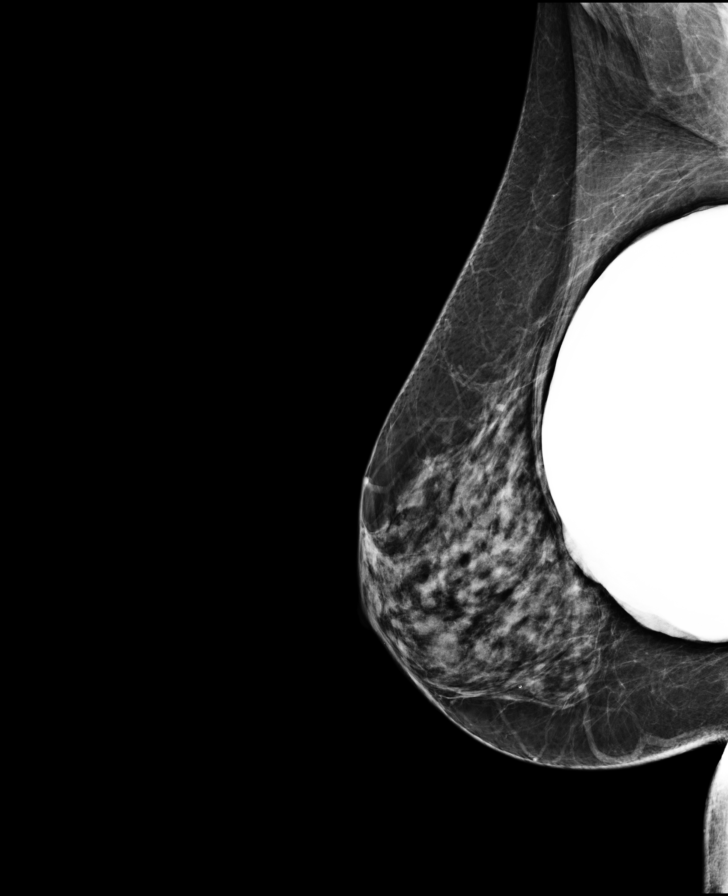

[L MLO synth-2D]
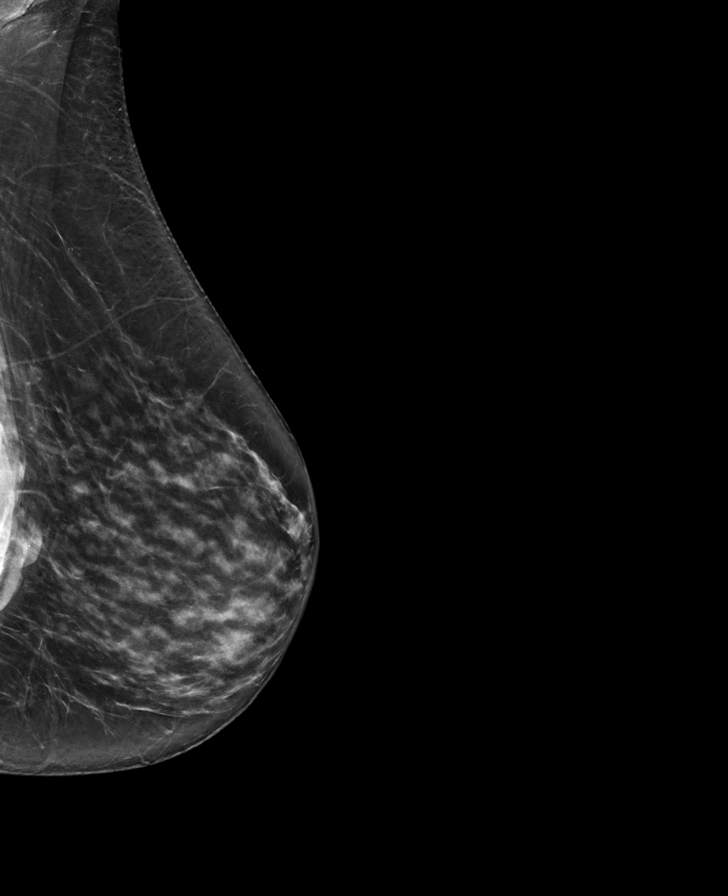

[R MLO synth-2D]
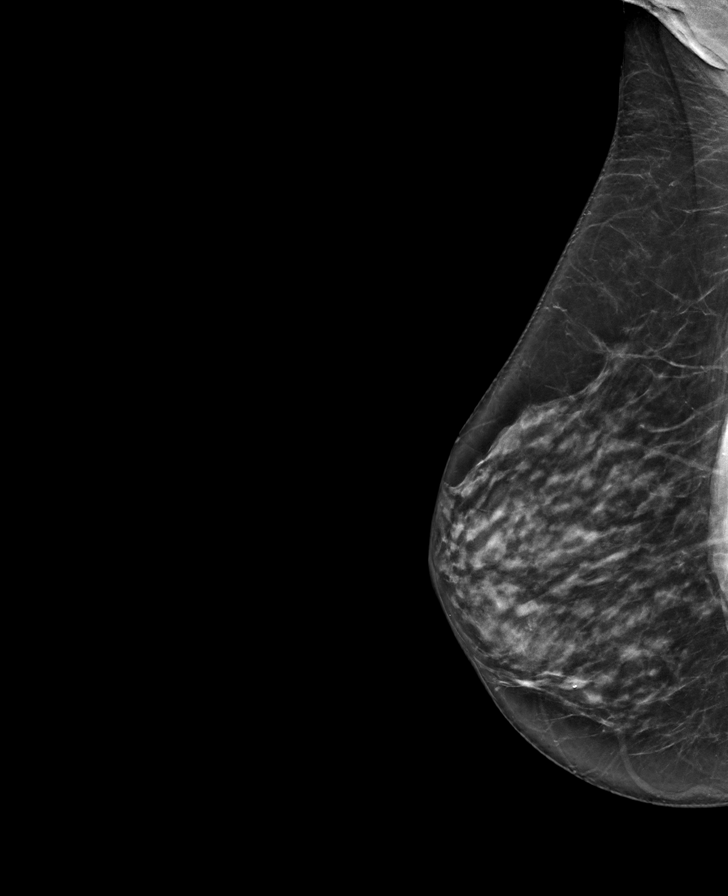

[R CC synth-2D]
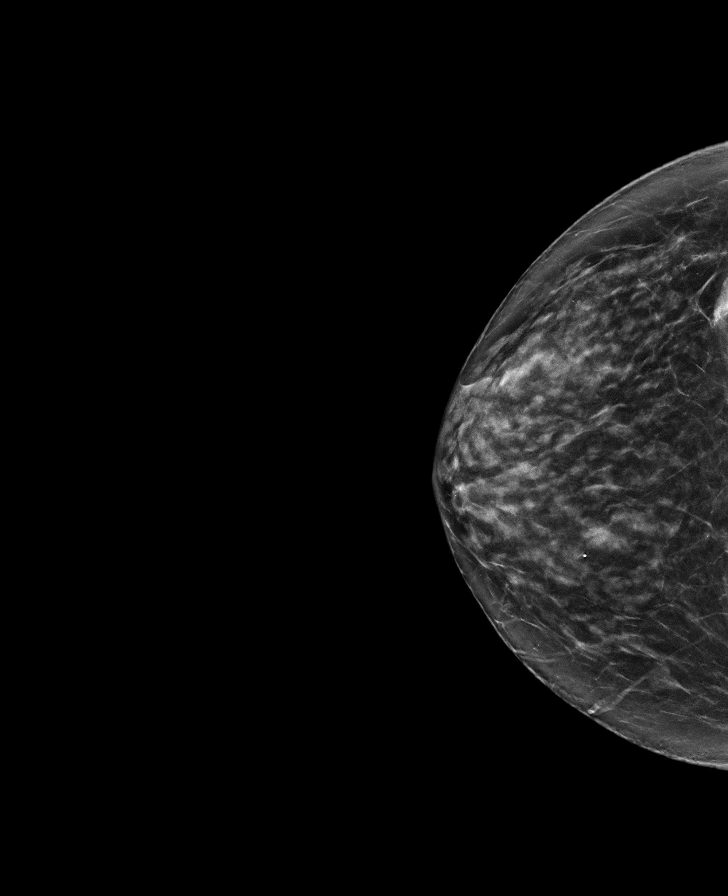

[L CC synth-2D]
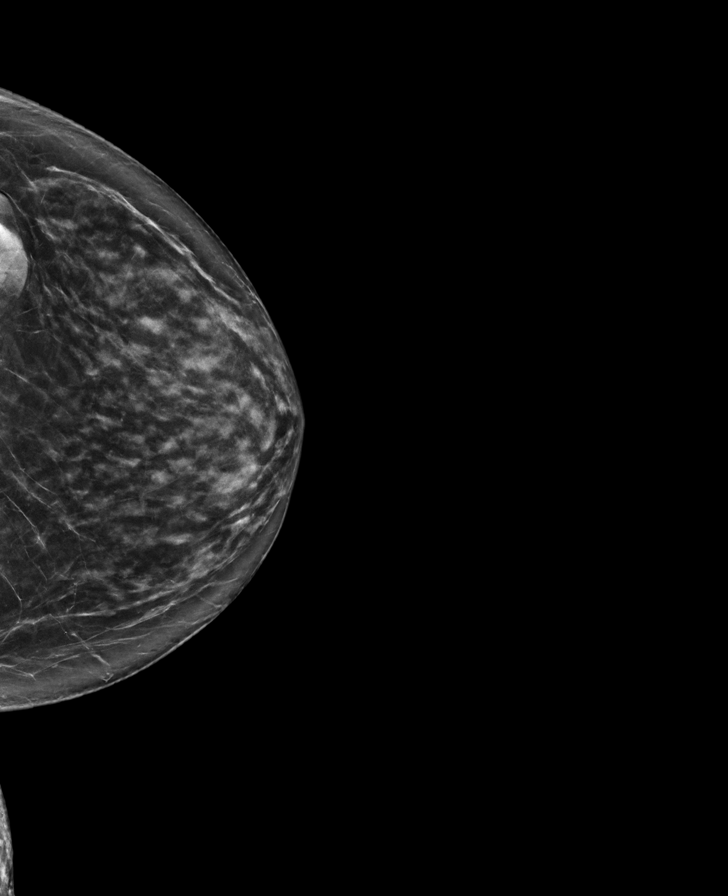

[8 of 28 positions shown; findings below may reference images not displayed]

ACR Breast Density Category c: The breast tissue is heterogeneously
dense, which may obscure small masses.
FINDINGS: There are no findings suspicious for malignancy. Images were
processed with CAD.
IMPRESSION: No mammographic evidence of malignancy. A result letter of this
screening mammogram will be mailed directly to the patient.

RECOMMENDATION:
Screening mammogram in one year. (Code:49-X-OQ9)

BI-RADS CATEGORY  1:  Negative.

## 2021-12-13 DIAGNOSIS — R059 Cough, unspecified: Secondary | ICD-10-CM | POA: Diagnosis not present

## 2021-12-13 DIAGNOSIS — J301 Allergic rhinitis due to pollen: Secondary | ICD-10-CM | POA: Diagnosis not present

## 2021-12-13 DIAGNOSIS — J3089 Other allergic rhinitis: Secondary | ICD-10-CM | POA: Diagnosis not present

## 2022-01-13 DIAGNOSIS — E785 Hyperlipidemia, unspecified: Secondary | ICD-10-CM | POA: Diagnosis not present

## 2022-01-13 DIAGNOSIS — E079 Disorder of thyroid, unspecified: Secondary | ICD-10-CM | POA: Diagnosis not present

## 2022-01-13 DIAGNOSIS — Z961 Presence of intraocular lens: Secondary | ICD-10-CM | POA: Diagnosis not present

## 2022-05-15 ENCOUNTER — Ambulatory Visit
Admission: EM | Admit: 2022-05-15 | Discharge: 2022-05-15 | Disposition: A | Discharge status: Home / Self Care | Payer: PPO | Attending: Family Medicine | Admitting: Family Medicine

## 2022-05-15 ENCOUNTER — Ambulatory Visit (INDEPENDENT_AMBULATORY_CARE_PROVIDER_SITE_OTHER): Payer: PPO

## 2022-05-15 DIAGNOSIS — R0602 Shortness of breath: Secondary | ICD-10-CM | POA: Diagnosis not present

## 2022-05-15 DIAGNOSIS — J4541 Moderate persistent asthma with (acute) exacerbation: Secondary | ICD-10-CM | POA: Diagnosis not present

## 2022-05-15 HISTORY — DX: Unspecified asthma, uncomplicated: J45.909

## 2022-05-15 MED ORDER — PREDNISONE 20 MG PO TABS: ORAL_TABLET | ORAL | 0 refills | Status: DC

## 2022-05-15 NOTE — Discharge Instructions (Addendum)
Your chest x-ray was clear  Take prednisone 20 mg--3 tabs daily x3 days, then 2 tabs daily x3 days, then 1 tab daily x3 days, then one half tab daily x3 days, then stop
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
## 2022-05-15 NOTE — ED Triage Notes (Signed)
Patient presents to Urgent Care with complaints of sob, post nasal drip, and hoarseness since 2 weeks ago. Patient reports using albuterol and breo for symptoms, pt reports taking Claritin, singular.

## 2022-06-20 DIAGNOSIS — J3089 Other allergic rhinitis: Secondary | ICD-10-CM | POA: Diagnosis not present

## 2022-06-20 DIAGNOSIS — J301 Allergic rhinitis due to pollen: Secondary | ICD-10-CM | POA: Diagnosis not present

## 2022-06-20 DIAGNOSIS — K219 Gastro-esophageal reflux disease without esophagitis: Secondary | ICD-10-CM | POA: Diagnosis not present

## 2022-06-20 DIAGNOSIS — R052 Subacute cough: Secondary | ICD-10-CM | POA: Diagnosis not present

## 2022-09-16 ENCOUNTER — Other Ambulatory Visit: Payer: Self-pay | Admitting: Family Medicine

## 2022-09-16 ENCOUNTER — Encounter: Payer: Self-pay | Admitting: Family Medicine

## 2022-09-16 DIAGNOSIS — Z1231 Encounter for screening mammogram for malignant neoplasm of breast: Secondary | ICD-10-CM

## 2022-10-29 ENCOUNTER — Ambulatory Visit
Admission: RE | Admit: 2022-10-29 | Discharge: 2022-10-29 | Disposition: A | Discharge status: Home / Self Care | Payer: PPO | Source: Ambulatory Visit | Attending: Family Medicine | Admitting: Family Medicine

## 2022-10-29 DIAGNOSIS — Z1231 Encounter for screening mammogram for malignant neoplasm of breast: Secondary | ICD-10-CM | POA: Diagnosis not present

## 2022-12-04 DIAGNOSIS — L298 Other pruritus: Secondary | ICD-10-CM | POA: Diagnosis not present

## 2022-12-04 DIAGNOSIS — Z1283 Encounter for screening for malignant neoplasm of skin: Secondary | ICD-10-CM | POA: Diagnosis not present

## 2022-12-07 ENCOUNTER — Ambulatory Visit
Admission: EM | Admit: 2022-12-07 | Discharge: 2022-12-07 | Disposition: A | Discharge status: Home / Self Care | Payer: PPO | Attending: Internal Medicine | Admitting: Internal Medicine

## 2022-12-07 DIAGNOSIS — R21 Rash and other nonspecific skin eruption: Secondary | ICD-10-CM

## 2022-12-07 MED ORDER — PREDNISONE 10 MG (21) PO TBPK: ORAL_TABLET | Freq: Every day | ORAL | 0 refills | Status: DC

## 2022-12-07 NOTE — ED Triage Notes (Signed)
Pt c/o rash onset ~ a couple of weeks ago started as a patch and has progressively gotten worse. Mostly on chest now moving under arms and neck.
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
## 2022-12-07 NOTE — Discharge Instructions (Signed)
I have prescribed prednisone taper to help alleviate your symptoms.  Please follow-up if any symptoms persist or worsen.

## 2022-12-23 DIAGNOSIS — E785 Hyperlipidemia, unspecified: Secondary | ICD-10-CM | POA: Diagnosis not present

## 2022-12-23 DIAGNOSIS — K219 Gastro-esophageal reflux disease without esophagitis: Secondary | ICD-10-CM | POA: Diagnosis not present

## 2022-12-23 DIAGNOSIS — G2581 Restless legs syndrome: Secondary | ICD-10-CM | POA: Diagnosis not present

## 2022-12-23 DIAGNOSIS — J309 Allergic rhinitis, unspecified: Secondary | ICD-10-CM | POA: Diagnosis not present

## 2022-12-23 DIAGNOSIS — R232 Flushing: Secondary | ICD-10-CM | POA: Diagnosis not present

## 2022-12-23 DIAGNOSIS — Z0000 Encounter for general adult medical examination without abnormal findings: Secondary | ICD-10-CM | POA: Diagnosis not present

## 2022-12-23 DIAGNOSIS — E079 Disorder of thyroid, unspecified: Secondary | ICD-10-CM | POA: Diagnosis not present

## 2022-12-23 DIAGNOSIS — G4700 Insomnia, unspecified: Secondary | ICD-10-CM | POA: Diagnosis not present

## 2023-01-15 DIAGNOSIS — Z961 Presence of intraocular lens: Secondary | ICD-10-CM | POA: Diagnosis not present

## 2023-01-15 DIAGNOSIS — H26493 Other secondary cataract, bilateral: Secondary | ICD-10-CM | POA: Diagnosis not present

## 2023-01-15 DIAGNOSIS — H43813 Vitreous degeneration, bilateral: Secondary | ICD-10-CM | POA: Diagnosis not present

## 2023-02-18 DIAGNOSIS — H1013 Acute atopic conjunctivitis, bilateral: Secondary | ICD-10-CM | POA: Diagnosis not present

## 2023-02-20 DIAGNOSIS — M545 Low back pain, unspecified: Secondary | ICD-10-CM | POA: Diagnosis not present

## 2023-02-24 DIAGNOSIS — M545 Low back pain, unspecified: Secondary | ICD-10-CM | POA: Diagnosis not present

## 2023-02-27 DIAGNOSIS — M545 Low back pain, unspecified: Secondary | ICD-10-CM | POA: Diagnosis not present

## 2023-03-02 DIAGNOSIS — M545 Low back pain, unspecified: Secondary | ICD-10-CM | POA: Diagnosis not present

## 2023-03-05 DIAGNOSIS — M545 Low back pain, unspecified: Secondary | ICD-10-CM | POA: Diagnosis not present

## 2023-03-10 DIAGNOSIS — M545 Low back pain, unspecified: Secondary | ICD-10-CM | POA: Diagnosis not present

## 2023-03-12 DIAGNOSIS — M545 Low back pain, unspecified: Secondary | ICD-10-CM | POA: Diagnosis not present

## 2023-03-27 DIAGNOSIS — E079 Disorder of thyroid, unspecified: Secondary | ICD-10-CM | POA: Diagnosis not present

## 2023-04-20 DIAGNOSIS — D225 Melanocytic nevi of trunk: Secondary | ICD-10-CM | POA: Diagnosis not present

## 2023-04-20 DIAGNOSIS — D2271 Melanocytic nevi of right lower limb, including hip: Secondary | ICD-10-CM | POA: Diagnosis not present

## 2023-04-20 DIAGNOSIS — L821 Other seborrheic keratosis: Secondary | ICD-10-CM | POA: Diagnosis not present

## 2023-04-20 DIAGNOSIS — D22122 Melanocytic nevi of left lower eyelid, including canthus: Secondary | ICD-10-CM | POA: Diagnosis not present

## 2023-04-20 DIAGNOSIS — D2261 Melanocytic nevi of right upper limb, including shoulder: Secondary | ICD-10-CM | POA: Diagnosis not present

## 2023-04-20 DIAGNOSIS — D2272 Melanocytic nevi of left lower limb, including hip: Secondary | ICD-10-CM | POA: Diagnosis not present

## 2023-04-20 DIAGNOSIS — D2262 Melanocytic nevi of left upper limb, including shoulder: Secondary | ICD-10-CM | POA: Diagnosis not present

## 2023-06-19 DIAGNOSIS — J301 Allergic rhinitis due to pollen: Secondary | ICD-10-CM | POA: Diagnosis not present

## 2023-06-19 DIAGNOSIS — R052 Subacute cough: Secondary | ICD-10-CM | POA: Diagnosis not present

## 2023-06-19 DIAGNOSIS — K219 Gastro-esophageal reflux disease without esophagitis: Secondary | ICD-10-CM | POA: Diagnosis not present

## 2023-06-19 DIAGNOSIS — J3089 Other allergic rhinitis: Secondary | ICD-10-CM | POA: Diagnosis not present

## 2023-09-29 ENCOUNTER — Other Ambulatory Visit: Payer: Self-pay | Admitting: Family Medicine

## 2023-09-29 DIAGNOSIS — Z1231 Encounter for screening mammogram for malignant neoplasm of breast: Secondary | ICD-10-CM

## 2023-11-05 ENCOUNTER — Ambulatory Visit
Admission: RE | Admit: 2023-11-05 | Discharge: 2023-11-05 | Disposition: A | Discharge status: Home / Self Care | Payer: PPO | Source: Ambulatory Visit | Attending: Family Medicine | Admitting: Family Medicine

## 2023-11-05 DIAGNOSIS — Z1231 Encounter for screening mammogram for malignant neoplasm of breast: Secondary | ICD-10-CM | POA: Insufficient documentation

## 2023-11-18 ENCOUNTER — Ambulatory Visit: Payer: PPO | Admitting: Obstetrics and Gynecology
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
## 2023-11-27 ENCOUNTER — Encounter: Payer: Self-pay | Admitting: Obstetrics and Gynecology

## 2023-11-27 ENCOUNTER — Ambulatory Visit (INDEPENDENT_AMBULATORY_CARE_PROVIDER_SITE_OTHER): Payer: PPO | Admitting: Obstetrics and Gynecology

## 2023-11-27 VITALS — BP 105/80 | HR 78 | Resp 16 | Ht 61.0 in | Wt 133.6 lb

## 2023-11-27 DIAGNOSIS — M85859 Other specified disorders of bone density and structure, unspecified thigh: Secondary | ICD-10-CM

## 2023-11-27 DIAGNOSIS — Z01419 Encounter for gynecological examination (general) (routine) without abnormal findings: Secondary | ICD-10-CM | POA: Diagnosis not present

## 2023-11-27 DIAGNOSIS — Z79890 Hormone replacement therapy (postmenopausal): Secondary | ICD-10-CM

## 2023-11-27 DIAGNOSIS — Z1231 Encounter for screening mammogram for malignant neoplasm of breast: Secondary | ICD-10-CM

## 2023-12-07 ENCOUNTER — Other Ambulatory Visit: Payer: Self-pay

## 2023-12-07 ENCOUNTER — Encounter (HOSPITAL_COMMUNITY): Payer: Self-pay

## 2023-12-07 ENCOUNTER — Emergency Department (HOSPITAL_COMMUNITY): Payer: PPO

## 2023-12-07 ENCOUNTER — Emergency Department (HOSPITAL_COMMUNITY)
Admission: EM | Admit: 2023-12-07 | Discharge: 2023-12-07 | Disposition: A | Discharge status: Home / Self Care | Payer: PPO | Attending: Emergency Medicine | Admitting: Emergency Medicine

## 2023-12-07 DIAGNOSIS — M4312 Spondylolisthesis, cervical region: Secondary | ICD-10-CM | POA: Diagnosis not present

## 2023-12-07 DIAGNOSIS — Z91040 Latex allergy status: Secondary | ICD-10-CM | POA: Insufficient documentation

## 2023-12-07 DIAGNOSIS — S0011XA Contusion of right eyelid and periocular area, initial encounter: Secondary | ICD-10-CM | POA: Insufficient documentation

## 2023-12-07 DIAGNOSIS — M62838 Other muscle spasm: Secondary | ICD-10-CM | POA: Diagnosis not present

## 2023-12-07 DIAGNOSIS — Z7982 Long term (current) use of aspirin: Secondary | ICD-10-CM | POA: Insufficient documentation

## 2023-12-07 DIAGNOSIS — E049 Nontoxic goiter, unspecified: Secondary | ICD-10-CM | POA: Diagnosis not present

## 2023-12-07 DIAGNOSIS — M542 Cervicalgia: Secondary | ICD-10-CM | POA: Diagnosis not present

## 2023-12-07 DIAGNOSIS — R519 Headache, unspecified: Secondary | ICD-10-CM | POA: Diagnosis not present

## 2023-12-07 DIAGNOSIS — M160 Bilateral primary osteoarthritis of hip: Secondary | ICD-10-CM | POA: Diagnosis not present

## 2023-12-07 DIAGNOSIS — S0511XA Contusion of eyeball and orbital tissues, right eye, initial encounter: Secondary | ICD-10-CM | POA: Diagnosis not present

## 2023-12-07 DIAGNOSIS — W19XXXA Unspecified fall, initial encounter: Secondary | ICD-10-CM | POA: Insufficient documentation

## 2023-12-07 DIAGNOSIS — S0990XA Unspecified injury of head, initial encounter: Secondary | ICD-10-CM | POA: Diagnosis present

## 2023-12-07 DIAGNOSIS — M25511 Pain in right shoulder: Secondary | ICD-10-CM | POA: Diagnosis not present

## 2023-12-07 DIAGNOSIS — M25551 Pain in right hip: Secondary | ICD-10-CM | POA: Insufficient documentation

## 2023-12-07 DIAGNOSIS — Z043 Encounter for examination and observation following other accident: Secondary | ICD-10-CM | POA: Diagnosis not present

## 2023-12-07 MED ORDER — LIDOCAINE 5 % EX PTCH: 1.0000 | MEDICATED_PATCH | CUTANEOUS | 0 refills | Status: AC

## 2023-12-07 NOTE — Discharge Instructions (Signed)
Your history, exam, and evaluation today did not reveal any significant traumatic injuries from your fall 4 days ago.  I suspect a postconcussive headache and some muscle soreness and spasm as we discussed.  The x-rays did not show acute fracture or intracranial bleeding but did show that known arthritis in your neck.  We had a shared decision-making conversation about doing further eye evaluation but given your unchanged vision and lack of eye pain itself, we agreed to hold on that and have you follow-up with your ophthalmologist.  Please rest and stay hydrated and continue using the over-the-counter medications.  Consider using the Lidoderm patch to help with the muscle soreness and spasm in the shoulder or hip.  If any symptoms change or worsen acutely, return to the nearest emergency department.

## 2023-12-07 NOTE — ED Provider Notes (Signed)
[place] EMERGENCY DEPARTMENT AT [facility] Provider Note   CSN: [id number] Arrival date & time: 12/07/23  1000     History  Chief Complaint  Patient presents with   Gwendolyn Holmes is a 73 y.o. female.  The history is provided by the patient and medical records. No language interpreter was used.  Fall This is a new problem. The current episode started more than 2 days ago. The problem occurs rarely. The problem has not changed since onset.Associated symptoms include headaches. Pertinent negatives include no chest pain, no abdominal pain and no shortness of breath. Nothing aggravates the symptoms. Nothing relieves the symptoms. She has tried nothing for the symptoms. The treatment provided no relief.       Home Medications Prior to Admission medications   Medication Sig Start Date End Date Taking? Authorizing Provider  aspirin 81 MG chewable tablet Chew by mouth daily.    [provider]  Calcium Carb-Cholecalciferol (CALCIUM 600 + D PO) Take by mouth daily.    [provider]  estradiol (ESTRACE) 0.5 MG tablet Take 1 tablet (0.5 mg total) by mouth daily. 07/17/20   Hildred Laser, MD  levothyroxine (SYNTHROID, LEVOTHROID) 50 MCG tablet TAKE 1 TABLET BY MOUTH EVERY DAY ON AN EMPTY STOMACH AT LEAST 30 TO 60 MINUTES BEFORE BREAKFAST 02/15/18   [provider]  Multiple Vitamin (MULTIVITAMIN) tablet Take 1 tablet by mouth daily.    [provider]  ofloxacin (FLOXIN) 0.3 % OTIC solution Place 5 drops into the left ear as needed.    [provider]  omeprazole (PRILOSEC) 20 MG capsule As needed 07/06/20   [provider]  rOPINIRole (REQUIP) 0.5 MG tablet Take 0.5 mg by mouth daily as needed. 09/17/23 11/23/24  [provider]  rosuvastatin (CRESTOR) 10 MG tablet Take 1 tablet by mouth daily. 10/05/23   [provider]      Allergies    Latex    Review of Systems   Review of Systems   Constitutional:  Negative for chills, fatigue and fever.  HENT:  Negative for congestion.   Eyes:  Positive for visual disturbance (chronic and unchanged).  Respiratory:  Negative for cough, chest tightness, shortness of breath and wheezing.   Cardiovascular:  Negative for chest pain.  Gastrointestinal:  Positive for nausea. Negative for abdominal pain, constipation, diarrhea and vomiting.  Genitourinary:  Negative for dysuria and flank pain.  Musculoskeletal:  Positive for neck pain. Negative for back pain and neck stiffness.  Skin:  Negative for rash and wound.  Neurological:  Positive for headaches. Negative for dizziness, facial asymmetry, speech difficulty, weakness and light-headedness.  Psychiatric/Behavioral:  Negative for agitation and confusion.   All other systems reviewed and are negative.   Physical Exam Updated Vital Signs BP 121/65 (BP Location: Left Arm)   Pulse 65   Temp 98.2 F (36.8 C)   Resp 16   Ht 5\' 1"  (1.549 m)   Wt 60.6 kg   SpO2 100%   BMI 25.24 kg/m  Physical Exam Vitals and nursing note reviewed.  Constitutional:      General: She is not in acute distress.    Appearance: She is well-developed. She is not ill-appearing, toxic-appearing or diaphoretic.  HENT:     Head: Abrasion and contusion present. No raccoon eyes, Battle's sign or laceration.      Comments: Bruising tenderness and small abrasion on right temporal area and right lateral superior orbital area.  Pupils symmetric and reactive with normal extraocular movements.  No limited movement of her eyes.  No Battle sign.  Neck slightly tender on right lateral neck.  No crepitance.  Oropharyngeal exam normal.    Nose: No congestion or rhinorrhea.     Mouth/Throat:     Mouth: Mucous membranes are moist.     Pharynx: No oropharyngeal exudate or posterior oropharyngeal erythema.  Eyes:     Extraocular Movements: Extraocular movements intact.     Conjunctiva/sclera: Conjunctivae normal.      Pupils: Pupils are equal, round, and reactive to light.  Neck:   Cardiovascular:     Rate and Rhythm: Normal rate and regular rhythm.     Heart sounds: No murmur heard. Pulmonary:     Effort: Pulmonary effort is normal. No respiratory distress.     Breath sounds: Normal breath sounds. No wheezing, rhonchi or rales.  Chest:     Chest wall: No tenderness.  Abdominal:     General: Abdomen is flat.     Palpations: Abdomen is soft.     Tenderness: There is no abdominal tenderness. There is no right CVA tenderness, left CVA tenderness, guarding or rebound.  Musculoskeletal:        General: Tenderness present. No swelling.       Arms:     Cervical back: Neck supple. Tenderness present. Muscular tenderness present. No spinous process tenderness.     Right lower leg: No edema.     Left lower leg: No edema.       Legs:     Comments: No numbness, ting, weakness of legs.  Tenderness in the right hip area.  Good range of motion.  No back tenderness on exam.  Tenderness in right shoulder and pain with some movement.  She could touch her right hand to her left shoulder.  Skin:    General: Skin is warm and dry.     Capillary Refill: Capillary refill takes less than 2 seconds.     Findings: Bruising present. No rash.  Neurological:     General: No focal deficit present.     Mental Status: She is alert. Mental status is at baseline.     Sensory: No sensory deficit.     Motor: No weakness.  Psychiatric:        Mood and Affect: Mood normal.     ED Results / Procedures / Treatments   Labs (all labs ordered are listed, but only abnormal results are displayed) Labs Reviewed - No data to display  EKG None  Radiology No results found.  Procedures Procedures    Medications Ordered in ED Medications - No data to display  ED Course/ Medical Decision Making/ A&P                                 Medical Decision Making Amount and/or Complexity of Data Reviewed Radiology:  ordered.  Risk Prescription drug management.    Gwendolyn Holmes is a 73 y.o. female with past medical history significant for asthma, hypothyroidism, and vertigo who presents with pain after fall.  According patient, about 4 days ago she fell getting up in the melanite to go to the bathroom to blow her nose when she tripped and fell onto her right side.  She is right-handed.  She reports injuring her right hip, right shoulder, and primarily her right head and neck.  She reports for the last 4  days she is been having persistent moderate headache around her right orbit and her right temporal area.  She denies any vision changes from baseline.  She chronically has some difficulty with vision in her right eye that is slightly blurry but this is not any different.  She reports some neck soreness and tension from the fall.  She reports some nausea but no vomiting.  Denies speech changes.  Denies any numbness, tingling, weakness of extremities.  Denies any chest pain back pain or abdominal pain.  She reports she is having pain with moving her right shoulder.  Denies any deep lacerations.  She reports a small abrasion to her right lateral orbit area.  On exam, lungs clear.  Chest nontender.  Back nontender.  Abdomen nontender.  Tenderness in the right hip but has intact sensation strength and when I move her right hip around it was not significantly painful.  She was tender on the hip area.  Back otherwise nontender.  She had tenderness in the right shoulder but was able to touch her right hand to the left shoulder.  Intact sensation, strength, and pulses in her right upper extremity.  Neck was tender laterally on the right side but midline was less tender.  She had tenderness in her right temporal area but there was no crepitance.  She has some swelling in the superior right orbit and on the right side.  There is small abrasion.  Pupils are symmetric and reactive and extraocular movements were intact.  No evidence  of entrapment on exam.  No nasal septal hematoma seen and oropharyngeal exam unremarkable.  Patient otherwise well-appearing.  We agreed to get CT of the head neck and face to look for significant traumatic injuries or fractures or bleeding.  We will also get x-ray of the right shoulder and of the right hip.  Given her lack of other injuries we will hold on further workup.  As her fall sounded mechanical he did not have any preceding palpitations chest pain or shortness of breath we will hold on more extensive lab workup or imaging workup at this time.  Patient agrees.  If imaging reassuring, anticipate discharge with Lidoderm patches and muscle relaxant for likely muscle spasm in her joints and having her take over-the-counter medications for suspected postconcussive headache that is persistent.  Given her lack of any vision changes from her baseline or eye pain itself, will hold on staining of the eye or fluorescein exam at this time.  Patient reports to me that she is concerned about bleed because a family friend passed away several days after head injury recently.  Anticipate reassessment after workup.           12:03 PM Imaging returned overall reassuring.  Patient did not have acute fracture of the skull face or neck and did not have an intracranial bleeding.  X-rays of the shoulder and hip were also reassuring.  Suspect soft tissue injury and muscle skeletal pain and spasm.  We went over all the findings together.  She reports he did have remote injury to her neck from previous surgery.  We again offered to perform staining in for evaluation of the eye but given her lack of vision changes or eye pain, she would have her see her ophthalmologist.  Patient will be given description for Lidoderm patches and will follow-up with her PCP.  She understood plan of care and follow instructions and was discharged in good condition.  Final Clinical Impression(s) / ED Diagnoses Final  diagnoses:  Fall, initial encounter  Contusion of right periocular region, initial encounter  Muscle spasm    Rx / DC Orders ED Discharge Orders          Ordered    lidocaine (LIDODERM) 5 %  Every 24 hours        12/07/23 1158            Clinical Impression: 1. Fall, initial encounter   2. Contusion of right periocular region, initial encounter   3. Muscle spasm     Disposition: Discharge  Condition: Good  I have discussed the results, Dx and Tx plan with the pt(& family if present). He/she/they expressed understanding and agree(s) with the plan. Discharge instructions discussed at great length. Strict return precautions discussed and pt &/or family have verbalized understanding of the instructions. No further questions at time of discharge.    New Prescriptions   LIDOCAINE (LIDODERM) 5 %    Place 1 patch onto the skin daily. Remove & Discard patch within 12 hours or as directed by MD    Follow Up: Jerl Mina, MD [address] [place] [address] [phone]     [facility] Emergency Department at [facility] [address] [place] [address] [phone]       Baileigh Modisette, Canary Brim, MD 12/07/23 1335

## 2023-12-07 NOTE — ED Triage Notes (Signed)
Patient reports she fell 4 days ago. Fell on her right side and hit her head. Denies LOC and does not take blood thinners. Patient also c/o headache, nausea, right shoulder and right hip pain. Patient is neuro intact at time of triage.

## 2023-12-10 DIAGNOSIS — M2012 Hallux valgus (acquired), left foot: Secondary | ICD-10-CM | POA: Diagnosis not present

## 2023-12-16 ENCOUNTER — Other Ambulatory Visit: Payer: Self-pay | Admitting: Podiatry

## 2023-12-23 ENCOUNTER — Encounter: Payer: Self-pay | Admitting: Anesthesiology

## 2023-12-25 NOTE — Progress Notes (Signed)
In 1995, patient had ACDF.  After ACDF, patient developed right side vocal cord paralysis. Patient does not think she had any difficult intubation, but that the vocal cord paralysis was "related to the surgery."    Subsequent to right vocal cord paralysis, patient saw Dr. Suzanna Obey, ENT in [place]. Initially. Dr. Jearld Fenton counseled watchful waiting, but vocal cord did not return to function, so in 1995 or 1996, Dr. Jearld Fenton performed a Type I thyroplasty (this information is from letter patient received from Dr. Jearld Fenton).  Surgery was at [facility].   Since type I thyroplasty, patient has done fairly well, but repots that she still "sometimes chokes on saliva," or on "spicy food."  Takes omeprazole for severe GERD, which may occur just when "bending over."    I discussed facts of this type of case, but not patient name nor any PHI, with ENT and with another anesthesiologist, and reviewed literature on anesthesia for patients with Type I thyroplasty.    After Type I thyroplasty, there is an increased risk of difficult intubation, smaller diameter airway, and even a possibility of dislodging or interrupting the repair. There is definite risk of hoarseness and alteration of voice, as well as alteration of vocal cord function if intubated. Patient states she has been told she must not be intubated due to those risks.    However, patient also has GERD that bothers her "when I bend over." Patient takes omeprazole for GERD. She has moderate persistent asthma with allergic rhinitis.   Based on patient co-morbidities, and discussion with ENT and another anesthesiologist, the concensus is that patient should have surgery in main OR, potentially with short-acting spinal anesthetic, to avoid airway instrumentation. Have discussed possible nerve block with surgeon for tourniquet pain and for intra-op pain, but since nerve block may not be 100% effective, the discussion tends toward short-acting spinal, though  other modalities could also be considered as well.

## 2023-12-28 DIAGNOSIS — E039 Hypothyroidism, unspecified: Secondary | ICD-10-CM | POA: Diagnosis not present

## 2023-12-28 DIAGNOSIS — B001 Herpesviral vesicular dermatitis: Secondary | ICD-10-CM | POA: Diagnosis not present

## 2023-12-28 DIAGNOSIS — E079 Disorder of thyroid, unspecified: Secondary | ICD-10-CM | POA: Diagnosis not present

## 2023-12-28 DIAGNOSIS — Z0000 Encounter for general adult medical examination without abnormal findings: Secondary | ICD-10-CM | POA: Diagnosis not present

## 2023-12-28 DIAGNOSIS — G2581 Restless legs syndrome: Secondary | ICD-10-CM | POA: Diagnosis not present

## 2023-12-28 DIAGNOSIS — T7840XA Allergy, unspecified, initial encounter: Secondary | ICD-10-CM | POA: Diagnosis not present

## 2023-12-28 DIAGNOSIS — E785 Hyperlipidemia, unspecified: Secondary | ICD-10-CM | POA: Diagnosis not present

## 2024-01-04 ENCOUNTER — Encounter: Payer: Self-pay | Admitting: Podiatry

## 2024-01-04 ENCOUNTER — Encounter
Admission: RE | Admit: 2024-01-04 | Discharge: 2024-01-04 | Disposition: A | Discharge status: Home / Self Care | Payer: PPO | Source: Ambulatory Visit | Attending: Podiatry | Admitting: Podiatry

## 2024-01-04 ENCOUNTER — Other Ambulatory Visit: Payer: Self-pay

## 2024-01-04 VITALS — Ht 61.0 in | Wt 125.0 lb

## 2024-01-04 DIAGNOSIS — E782 Mixed hyperlipidemia: Secondary | ICD-10-CM

## 2024-01-04 HISTORY — DX: Other complications of anesthesia, initial encounter: T88.59XA

## 2024-01-04 NOTE — Patient Instructions (Addendum)
 Marland Kitchen

## 2024-01-05 ENCOUNTER — Encounter
Admission: RE | Admit: 2024-01-05 | Discharge: 2024-01-05 | Disposition: A | Discharge status: Home / Self Care | Payer: PPO | Source: Ambulatory Visit | Attending: Podiatry | Admitting: Podiatry

## 2024-01-05 DIAGNOSIS — E782 Mixed hyperlipidemia: Secondary | ICD-10-CM | POA: Insufficient documentation

## 2024-01-05 DIAGNOSIS — Z01810 Encounter for preprocedural cardiovascular examination: Secondary | ICD-10-CM | POA: Insufficient documentation

## 2024-01-07 MED ORDER — ORAL CARE MOUTH RINSE: 15.0000 mL | Freq: Once | OROMUCOSAL | Status: AC

## 2024-01-07 MED ORDER — CEFAZOLIN SODIUM-DEXTROSE 2-4 GM/100ML-% IV SOLN
2.0000 g | INTRAVENOUS | Status: AC
  Administered 2024-01-08: 2 g via INTRAVENOUS

## 2024-01-07 MED ORDER — CHLORHEXIDINE GLUCONATE 0.12 % MT SOLN
15.0000 mL | Freq: Once | OROMUCOSAL | Status: AC
  Administered 2024-01-08: 15 mL via OROMUCOSAL

## 2024-01-08 ENCOUNTER — Encounter: Payer: Self-pay | Admitting: Podiatry

## 2024-01-08 ENCOUNTER — Ambulatory Visit: Payer: PPO | Admitting: Anesthesiology

## 2024-01-08 ENCOUNTER — Other Ambulatory Visit: Payer: Self-pay

## 2024-01-08 ENCOUNTER — Ambulatory Visit: Payer: PPO

## 2024-01-08 ENCOUNTER — Ambulatory Visit
Admission: RE | Admit: 2024-01-08 | Discharge: 2024-01-08 | Disposition: A | Discharge status: Home / Self Care | Payer: PPO | Attending: Podiatry | Admitting: Podiatry

## 2024-01-08 ENCOUNTER — Encounter
Admission: RE | Disposition: A | Discharge status: Home / Self Care | Payer: Self-pay | Source: Home / Self Care | Attending: Podiatry

## 2024-01-08 DIAGNOSIS — M2012 Hallux valgus (acquired), left foot: Secondary | ICD-10-CM | POA: Diagnosis not present

## 2024-01-08 DIAGNOSIS — K219 Gastro-esophageal reflux disease without esophagitis: Secondary | ICD-10-CM | POA: Diagnosis not present

## 2024-01-08 DIAGNOSIS — J4540 Moderate persistent asthma, uncomplicated: Secondary | ICD-10-CM | POA: Diagnosis not present

## 2024-01-08 DIAGNOSIS — Z79899 Other long term (current) drug therapy: Secondary | ICD-10-CM | POA: Diagnosis not present

## 2024-01-08 DIAGNOSIS — Z9889 Other specified postprocedural states: Secondary | ICD-10-CM | POA: Diagnosis not present

## 2024-01-08 HISTORY — DX: Insomnia, unspecified: G47.00

## 2024-01-08 HISTORY — DX: Hyperlipidemia, unspecified: E78.5

## 2024-01-08 HISTORY — DX: Depression, unspecified: F32.A

## 2024-01-08 HISTORY — PX: AIKEN OSTEOTOMY: SHX6331

## 2024-01-08 HISTORY — DX: Long term (current) use of aspirin: Z79.82

## 2024-01-08 HISTORY — DX: Cataract extraction status, left eye: Z98.41

## 2024-01-08 HISTORY — DX: Restless legs syndrome: G25.81

## 2024-01-08 HISTORY — PX: BUNIONECTOMY: SHX129

## 2024-01-08 HISTORY — DX: Anxiety disorder, unspecified: F41.9

## 2024-01-08 HISTORY — DX: Cataract extraction status, right eye: Z98.41

## 2024-01-08 SURGERY — BUNIONECTOMY
Anesthesia: Regional | Site: Toe | Laterality: Left

## 2024-01-08 MED ORDER — CEFAZOLIN SODIUM-DEXTROSE 2-4 GM/100ML-% IV SOLN
INTRAVENOUS | Status: AC
  Filled 2024-01-08: qty 100

## 2024-01-08 MED ORDER — BUPIVACAINE LIPOSOME 1.3 % IJ SUSP
INTRAMUSCULAR | Status: AC
  Filled 2024-01-08: qty 10

## 2024-01-08 MED ORDER — BUPIVACAINE HCL (PF) 0.5 % IJ SOLN
INTRAMUSCULAR | Status: DC | PRN
  Administered 2024-01-08: 30 mL

## 2024-01-08 MED ORDER — MIDAZOLAM HCL 2 MG/2ML IJ SOLN
INTRAMUSCULAR | Status: AC
  Filled 2024-01-08: qty 2

## 2024-01-08 MED ORDER — ONDANSETRON HCL 4 MG/2ML IJ SOLN
INTRAMUSCULAR | Status: DC | PRN
  Administered 2024-01-08: 4 mg via INTRAVENOUS

## 2024-01-08 MED ORDER — MIDAZOLAM HCL 2 MG/2ML IJ SOLN
INTRAMUSCULAR | Status: DC | PRN
  Administered 2024-01-08 (×3): .5 mg via INTRAVENOUS
  Administered 2024-01-08: 1 mg via INTRAVENOUS
  Administered 2024-01-08: .5 mg via INTRAVENOUS

## 2024-01-08 MED ORDER — MIDAZOLAM HCL 2 MG/2ML IJ SOLN
1.0000 mg | INTRAMUSCULAR | Status: DC | PRN
  Administered 2024-01-08: 2 mg via INTRAVENOUS

## 2024-01-08 MED ORDER — FENTANYL CITRATE (PF) 100 MCG/2ML IJ SOLN
INTRAMUSCULAR | Status: AC
  Filled 2024-01-08: qty 2

## 2024-01-08 MED ORDER — PROPOFOL 10 MG/ML IV BOLUS
INTRAVENOUS | Status: AC
  Filled 2024-01-08: qty 20

## 2024-01-08 MED ORDER — FENTANYL CITRATE PF 50 MCG/ML IJ SOSY
50.0000 ug | PREFILLED_SYRINGE | Freq: Once | INTRAMUSCULAR | Status: AC
  Administered 2024-01-08: 50 ug via INTRAVENOUS

## 2024-01-08 MED ORDER — 0.9 % SODIUM CHLORIDE (POUR BTL) OPTIME
TOPICAL | Status: DC | PRN
  Administered 2024-01-08: 500 mL

## 2024-01-08 MED ORDER — OXYCODONE-ACETAMINOPHEN 5-325 MG PO TABS: 1.0000 | ORAL_TABLET | Freq: Four times a day (QID) | ORAL | 0 refills | Status: AC | PRN

## 2024-01-08 MED ORDER — LIDOCAINE HCL (PF) 1 % IJ SOLN
INTRAMUSCULAR | Status: AC
  Filled 2024-01-08: qty 30

## 2024-01-08 MED ORDER — BUPIVACAINE HCL (PF) 0.5 % IJ SOLN
INTRAMUSCULAR | Status: AC
  Filled 2024-01-08: qty 30

## 2024-01-08 MED ORDER — FENTANYL CITRATE (PF) 100 MCG/2ML IJ SOLN
INTRAMUSCULAR | Status: DC | PRN
  Administered 2024-01-08: 50 ug via INTRAVENOUS

## 2024-01-08 MED ORDER — FAMOTIDINE 20 MG PO TABS
ORAL_TABLET | ORAL | Status: AC
  Filled 2024-01-08: qty 1

## 2024-01-08 MED ORDER — BUPIVACAINE-EPINEPHRINE (PF) 0.25% -1:200000 IJ SOLN
INTRAMUSCULAR | Status: AC
  Filled 2024-01-08: qty 30

## 2024-01-08 MED ORDER — FENTANYL CITRATE PF 50 MCG/ML IJ SOSY
PREFILLED_SYRINGE | INTRAMUSCULAR | Status: AC
  Filled 2024-01-08: qty 1

## 2024-01-08 MED ORDER — LIDOCAINE HCL 1 % IJ SOLN
INTRAMUSCULAR | Status: DC | PRN
  Administered 2024-01-08: 10 mL

## 2024-01-08 MED ORDER — GLYCOPYRROLATE 0.2 MG/ML IJ SOLN
INTRAMUSCULAR | Status: DC | PRN
  Administered 2024-01-08: .2 mg via INTRAVENOUS

## 2024-01-08 MED ORDER — LACTATED RINGERS IV SOLN: INTRAVENOUS | Status: DC

## 2024-01-08 MED ORDER — CHLORHEXIDINE GLUCONATE 0.12 % MT SOLN
OROMUCOSAL | Status: AC
  Filled 2024-01-08: qty 15

## 2024-01-08 SURGICAL SUPPLY — 46 items
BLADE MED AGGRESSIVE (BLADE) ×1 IMPLANT
BLADE SAW LAPIPLASTY 40X11 (BLADE) IMPLANT
BLADE SURG 15 STRL LF DISP TIS (BLADE) ×2 IMPLANT
BNDG ELASTIC 4X5.8 VLCR NS LF (GAUZE/BANDAGES/DRESSINGS) ×2 IMPLANT
BNDG GAUZE DERMACEA FLUFF 4 (GAUZE/BANDAGES/DRESSINGS) ×1 IMPLANT
BNDG STRETCH GAUZE 3IN X12FT (GAUZE/BANDAGES/DRESSINGS) ×1 IMPLANT
BOOT STEPPER DURA SM (SOFTGOODS) IMPLANT
BUR 4X45 EGG (BURR) ×1 IMPLANT
COVER PIN YLW 0.028-062 (MISCELLANEOUS) ×1 IMPLANT
CUFF TOURN SGL QUICK 12 (TOURNIQUET CUFF) IMPLANT
CUFF TOURN SGL QUICK 18X4 (TOURNIQUET CUFF) IMPLANT
DRAPE FLUOR MINI C-ARM 54X84 (DRAPES) ×1 IMPLANT
DURAPREP 26ML APPLICATOR (WOUND CARE) ×1 IMPLANT
ELECT REM PT RETURN 9FT ADLT (ELECTROSURGICAL) ×1 IMPLANT
ELECTRODE REM PT RTRN 9FT ADLT (ELECTROSURGICAL) ×1 IMPLANT
GAUZE SPONGE 4X4 12PLY STRL (GAUZE/BANDAGES/DRESSINGS) ×1 IMPLANT
GAUZE STRETCH 2X75IN STRL (MISCELLANEOUS) ×1 IMPLANT
GAUZE XEROFORM 1X8 LF (GAUZE/BANDAGES/DRESSINGS) ×1 IMPLANT
GLOVE BIO SURGEON STRL SZ7.5 (GLOVE) ×1 IMPLANT
GLOVE INDICATOR 8.0 STRL GRN (GLOVE) ×1 IMPLANT
GOWN STRL REUS W/ TWL XL LVL3 (GOWN DISPOSABLE) ×2 IMPLANT
LABEL OR SOLS (LABEL) ×1 IMPLANT
LAPIPLASTY SYS 4A (Orthopedic Implant) ×1 IMPLANT
MANIFOLD NEPTUNE II (INSTRUMENTS) ×1 IMPLANT
NDL FILTER BLUNT 18X1 1/2 (NEEDLE) ×1 IMPLANT
NDL HYPO 25X1 1.5 SAFETY (NEEDLE) ×2 IMPLANT
NDL SAFETY ECLIPSE 18X1.5 (NEEDLE) IMPLANT
NEEDLE FILTER BLUNT 18X1 1/2 (NEEDLE) ×1 IMPLANT
NEEDLE HYPO 25X1 1.5 SAFETY (NEEDLE) ×2 IMPLANT
NS IRRIG 500ML POUR BTL (IV SOLUTION) ×1 IMPLANT
PACK EXTREMITY ARMC (MISCELLANEOUS) ×1 IMPLANT
RASP SM TEAR CROSS CUT (RASP) ×1 IMPLANT
SCREW 2.7 HIGH PITCH LOCKING (Screw) IMPLANT
SCREW HIGH PITCH LOCK 2.7 (Screw) IMPLANT
SPLINT CAST 1 STEP 4X30 (MISCELLANEOUS) ×1 IMPLANT
SPLINT PLASTER CAST FAST 5X30 (CAST SUPPLIES) ×1 IMPLANT
STOCKINETTE IMPERV 14X48 (MISCELLANEOUS) IMPLANT
STOCKINETTE M/LG 89821 (MISCELLANEOUS) ×1 IMPLANT
STRAP SAFETY 5IN WIDE (MISCELLANEOUS) ×1 IMPLANT
STRIP CLOSURE SKIN 1/4X4 (GAUZE/BANDAGES/DRESSINGS) ×1 IMPLANT
SYR 10ML LL (SYRINGE) ×1 IMPLANT
SYSTEM LAPIPLASTY 4A (Orthopedic Implant) IMPLANT
TRAP FLUID SMOKE EVACUATOR (MISCELLANEOUS) ×1 IMPLANT
WATER STERILE IRR 500ML POUR (IV SOLUTION) ×1 IMPLANT
WIRE Z .045 C-WIRE SPADE TIP (WIRE) ×2 IMPLANT
WIRE Z .062 C-WIRE SPADE TIP (WIRE) ×2 IMPLANT

## 2024-01-08 NOTE — H&P (Signed)
 HISTORY AND PHYSICAL INTERVAL NOTE:  01/08/2024  7:23 AM  Gwendolyn Holmes  has presented today for surgery, with the diagnosis of M20.12 - Hallux valgus of left foot.  The various methods of treatment have been discussed with the patient.  No guarantees were given.  After consideration of risks, benefits and other options for treatment, the patient has consented to surgery.  I have reviewed the patients' chart and labs.     A history and physical examination was performed in my office.  The patient was reexamined.  There have been no changes to this history and physical examination.  Gwendolyn Holmes A

## 2024-01-08 NOTE — Anesthesia Procedure Notes (Signed)
 Anesthesia Regional Block: Popliteal block   Pre-Anesthetic Checklist: , timeout performed,  Correct Patient, Correct Site, Correct Laterality,  Correct Procedure,, risks and benefits discussed,  Surgical consent,  Pre-op evaluation,  At surgeon's request and post-op pain management  Laterality: Left  Prep: chloraprep       Needles:  Injection technique: Single-shot  Needle Type: Echogenic Needle          Additional Needles:   Procedures:,,,, ultrasound used (permanent image in chart),,   Motor weakness within 20 minutes.  Narrative:  Start time: 01/08/2024 7:23 AM End time: 01/08/2024 7:25 AM Injection made incrementally with aspirations every 5 mL.  Performed by: Personally  Anesthesiologist: Reed Breech, MD  Additional Notes: Functioning IV was confirmed and monitors applied.  Sterile prep and drape, hand hygiene and sterile gloves were used. Ultrasound guidance: relevant anatomy identified, needle position confirmed, local anesthetic spread visualized around nerve(s), vascular puncture avoided.  Image saved to electronic medical record.  Negative aspiration prior to incremental administration of local anesthetic for total 20 ml bupivacaine 0.5% given in popliteal sciatic distribution. The patient tolerated the procedure well. Vital signs and moderate sedation medications recorded in RN notes.

## 2024-01-08 NOTE — Anesthesia Preprocedure Evaluation (Addendum)
 Anesthesia Evaluation  Patient identified by MRN, date of birth, ID band Patient awake    Reviewed: Allergy & Precautions, NPO status , Patient's Chart, lab work & pertinent test results  History of Anesthesia Complications (+) history of anesthetic complications (vocal cord paralysis)  Airway Mallampati: I   Neck ROM: Full    Dental no notable dental hx.    Pulmonary asthma    Pulmonary exam normal breath sounds clear to auscultation       Cardiovascular Exercise Tolerance: Good Normal cardiovascular exam Rhythm:Regular Rate:Normal  ECG 01/05/24:  Sinus bradycardia (HR 57) Otherwise normal ECG   Neuro/Psych  PSYCHIATRIC DISORDERS Anxiety Depression    Vertigo; vocal cord paralysis    GI/Hepatic ,GERD  ,,  Endo/Other  Hypothyroidism    Renal/GU negative Renal ROS     Musculoskeletal   Abdominal   Peds  Hematology negative hematology ROS (+)   Anesthesia Other Findings   Reproductive/Obstetrics                             Anesthesia Physical Anesthesia Plan  ASA: 2  Anesthesia Plan: General and Regional   Post-op Pain Management: Regional block*   Induction: Intravenous  PONV Risk Score and Plan: 3 and Propofol infusion, TIVA, Treatment may vary due to age or medical condition, Ondansetron and Dexamethasone  Airway Management Planned: Natural Airway  Additional Equipment:   Intra-op Plan:   Post-operative Plan:   Informed Consent: I have reviewed the patients History and Physical, chart, labs and discussed the procedure including the risks, benefits and alternatives for the proposed anesthesia with the patient or authorized representative who has indicated his/her understanding and acceptance.       Plan Discussed with: CRNA  Anesthesia Plan Comments: (Plan for preoperative popliteal sciatic and adductor saphenous nerve blocks and GA with natural airway.  Patient wants  to avoid intubation at all costs, so plan for spinal backup.  Patient consented for risks of anesthesia including but not limited to:  - adverse reactions to medications - damage to eyes, teeth, lips or other oral mucosa - nerve damage due to positioning  - sore throat or hoarseness - damage to heart, brain, nerves, lungs, other parts of body or loss of life  Informed patient about role of CRNA in peri- and intra-operative care.  Patient voiced understanding.)        Anesthesia Quick Evaluation

## 2024-01-08 NOTE — Transfer of Care (Signed)
 Immediate Anesthesia Transfer of Care Note  Patient: Gwendolyn Holmes  Procedure(s) Performed: Dorena Dew TYPE (Left: Toe) [id number] Quintella Reichert OSTEOTOMY (Left: Toe)  Patient Location: PACU  Anesthesia Type:GA combined with regional for post-op pain  Level of Consciousness: awake, alert , and oriented  Airway & Oxygen Therapy: Patient Spontanous Breathing and Patient connected to nasal cannula oxygen  Post-op Assessment: Report given to RN, Post -op Vital signs reviewed and stable, and Patient moving all extremities  Post vital signs: Reviewed and stable  Last Vitals:  Vitals Value Taken Time  BP 106/56 01/08/24 0916  Temp    Pulse 60 01/08/24 0922  Resp 16 01/08/24 0922  SpO2 100 % 01/08/24 0922  Vitals shown include unfiled device data.  Last Pain:  Vitals:   01/08/24 0642  TempSrc: Temporal  PainSc: 4       Patients Stated Pain Goal: 0 (01/08/24 5784)  Complications: No notable events documented.

## 2024-01-08 NOTE — Anesthesia Procedure Notes (Signed)
 Anesthesia Regional Block: Adductor canal block   Pre-Anesthetic Checklist: , timeout performed,  Correct Patient, Correct Site, Correct Laterality,  Correct Procedure,, risks and benefits discussed,  Surgical consent,  Pre-op evaluation,  At surgeon's request and post-op pain management  Laterality: Left  Prep: chloraprep       Needles:  Injection technique: Single-shot  Needle Type: Echogenic Needle          Additional Needles:   Procedures:,,,, ultrasound used (permanent image in chart),,   Motor weakness within 20 minutes.  Narrative:  Start time: 01/08/2024 7:26 AM End time: 01/08/2024 7:27 AM Injection made incrementally with aspirations every 5 mL.  Performed by: Personally  Anesthesiologist: Reed Breech, MD  Additional Notes: Functioning IV was confirmed and monitors applied.  Sterile prep and drape, hand hygiene and sterile gloves were used. Ultrasound guidance: relevant anatomy identified, needle position confirmed, local anesthetic spread visualized around nerve(s), vascular puncture avoided.  Image saved to electronic medical record.  Negative aspiration prior to incremental administration of local anesthetic for total 10 ml bupivacaine 0.5% given in adductor saphenous distribution. The patient tolerated the procedure well. Vital signs and moderate sedation medications recorded in RN notes.

## 2024-01-08 NOTE — Anesthesia Postprocedure Evaluation (Signed)
 Anesthesia Post Note  Patient: Gwendolyn Holmes  Procedure(s) Performed: Dorena Dew TYPE (Left: Toe) [id number] Quintella Reichert OSTEOTOMY (Left: Toe)  Patient location during evaluation: PACU Anesthesia Type: Regional Level of consciousness: awake and alert, oriented and patient cooperative Pain management: pain level controlled Vital Signs Assessment: post-procedure vital signs reviewed and stable Respiratory status: spontaneous breathing, nonlabored ventilation and respiratory function stable Cardiovascular status: blood pressure returned to baseline and stable Postop Assessment: adequate PO intake Anesthetic complications: no   No notable events documented.   Last Vitals:  Vitals:   01/08/24 0930 01/08/24 0945  BP: 113/68 117/69  Pulse: 64 66  Resp: 18 18  Temp:  (!) 36.4 C  SpO2: 100% 100%    Last Pain:  Vitals:   01/08/24 1000  TempSrc:   PainSc: 0-No pain                 Reed Breech

## 2024-01-08 NOTE — Op Note (Signed)
 Operative note   Surgeon:Glendene Wyer [employer] forces logistics/support/administrative officer: None    Preop diagnosis: 1)Hallux valgus deformity left foot.    Postop diagnosis: Same    Procedure: 1)Lapidus hallux valgus correction left foot.  2) intraoperative fluoroscopy without assistance of radiologist    EBL: Minimal    Anesthesia:local, regional, and IV sedation local consists of a total of 10 cc of 1% plain lidocaine infiltrated along the incision site.  Patient had a popliteal block in the preoperative holding area    Hemostasis: Mid calf tourniquet inflated to 200 mmHg for approximately 65 minutes    Specimen: None    Complications: None    Operative indications:Gwendolyn Holmes is an 73 y.o. that presents today for surgical intervention.  The risks/benefits/alternatives/complications have been discussed and consent has been given.    Procedure:  Patient was brought into the OR and placed on the operating table in thesupine position. After anesthesia was obtained theleft lower extremity was prepped and draped in usual sterile fashion.  Attention was then directed to the dorsomedial first MTPJ where an incision was performed.  Sharp and blunt dissection was carried down to the capsule.  The intermetatarsal space was then entered.  The conjoined tendon of the abductor was then freed from the base of the proximal phalanx.  Attention was directed to the dorsal aspect of the foot where a dorsal incision was made at the first met cuneiforms joint.  Sharp and blunt dissection was carried down to the periosteum.  Subperiosteal dissection was then undertaken.  This exposed the first met cuneiform joint.  This was then freed and loosened.  A small fulcrum was placed between the base of the first metatarsal and second metatarsal.  Next the joint positioner was placed on the medial aspect of the metatarsal.  A small stab incision was made at the second metatarsal.  Compression was placed for the positioner.  Good realignment of the  first intermetatarsal angle was noted at this time.  At this time the osteotomy cut guide was placed into the joint region.  2 vertical cuts were then made.  The cartilage material was removed from the first met cuneiform joint and the joint was then prepped with a 2.0 mm drill bit.  The joint compressor was then placed.  Good compression and realignment was noted.  Next the medial and dorsal locking plates were then placed from the lapiplasty set.  Realignment and stability was noted in all planes.  Attention was redirected to the dorsomedial first MTPJ.  A small T capsulotomy was performed.  The dorsomedial eminence was noted and transected and smoothed with a power rasp.  A small capsulorrhaphy was performed.  Closure was then performed after all areas were irrigated.  3-0 Vicryl for the capsular tissue.  4-0 Vicryl in subcutaneous tissue and a 4-0 Monocryl for the skin.   Further local anesthesia was performed at the end of the case.    Patient tolerated the procedure and anesthesia well.  Was transported from the OR to the PACU with all vital signs stable and vascular status intact. To be discharged per routine protocol.  Will follow up in approximately 1 week in the outpatient clinic.

## 2024-01-08 NOTE — Discharge Instructions (Signed)
 [facility]  POST OPERATIVE INSTRUCTIONS FOR DR. Ether Griffins AND DR. BAKER [facility]   Take your medication as prescribed.  Pain medication should be taken only as needed.  Take and aspirin daily  Keep the dressing clean, dry and intact.  Keep your foot elevated above the heart level for the first 48 hours.  We have instructed you to be non-weight bearing.  Always wear your post-op shoe when walking.  Always use your crutches if you are to be non-weight bearing.  Do not take a shower. Baths are permissible as long as the foot is kept out of the water.   Every hour you are awake:  Bend your knee 15 times.  Call [facility] [phone]) if any of the following problems occur: You develop a temperature or fever. The bandage becomes saturated with blood. Medication does not stop your pain. Injury of the foot occurs. Any symptoms of infection including redness, odor, or red streaks running from wound.

## 2024-01-11 ENCOUNTER — Encounter: Payer: Self-pay | Admitting: Podiatry

## 2024-01-12 DIAGNOSIS — M2012 Hallux valgus (acquired), left foot: Secondary | ICD-10-CM | POA: Diagnosis not present

## 2024-01-27 ENCOUNTER — Encounter: Payer: Self-pay | Admitting: Podiatry

## 2024-02-16 DIAGNOSIS — M2012 Hallux valgus (acquired), left foot: Secondary | ICD-10-CM | POA: Diagnosis not present

## 2024-03-08 DIAGNOSIS — H029 Unspecified disorder of eyelid: Secondary | ICD-10-CM | POA: Diagnosis not present

## 2024-03-08 DIAGNOSIS — Z961 Presence of intraocular lens: Secondary | ICD-10-CM | POA: Diagnosis not present

## 2024-03-08 DIAGNOSIS — H43813 Vitreous degeneration, bilateral: Secondary | ICD-10-CM | POA: Diagnosis not present

## 2024-03-29 DIAGNOSIS — M2012 Hallux valgus (acquired), left foot: Secondary | ICD-10-CM | POA: Diagnosis not present

## 2024-06-07 DIAGNOSIS — H6063 Unspecified chronic otitis externa, bilateral: Secondary | ICD-10-CM | POA: Diagnosis not present

## 2024-06-07 DIAGNOSIS — J3801 Paralysis of vocal cords and larynx, unilateral: Secondary | ICD-10-CM | POA: Diagnosis not present

## 2024-06-07 DIAGNOSIS — J301 Allergic rhinitis due to pollen: Secondary | ICD-10-CM | POA: Diagnosis not present

## 2024-06-07 DIAGNOSIS — H6123 Impacted cerumen, bilateral: Secondary | ICD-10-CM | POA: Diagnosis not present

## 2024-07-21 DIAGNOSIS — H029 Unspecified disorder of eyelid: Secondary | ICD-10-CM | POA: Diagnosis not present

## 2024-08-19 DIAGNOSIS — D2261 Melanocytic nevi of right upper limb, including shoulder: Secondary | ICD-10-CM | POA: Diagnosis not present

## 2024-08-19 DIAGNOSIS — D2262 Melanocytic nevi of left upper limb, including shoulder: Secondary | ICD-10-CM | POA: Diagnosis not present

## 2024-08-19 DIAGNOSIS — D2272 Melanocytic nevi of left lower limb, including hip: Secondary | ICD-10-CM | POA: Diagnosis not present

## 2024-08-19 DIAGNOSIS — L821 Other seborrheic keratosis: Secondary | ICD-10-CM | POA: Diagnosis not present

## 2024-08-19 DIAGNOSIS — L708 Other acne: Secondary | ICD-10-CM | POA: Diagnosis not present

## 2024-08-19 DIAGNOSIS — D2271 Melanocytic nevi of right lower limb, including hip: Secondary | ICD-10-CM | POA: Diagnosis not present

## 2024-08-19 DIAGNOSIS — D225 Melanocytic nevi of trunk: Secondary | ICD-10-CM | POA: Diagnosis not present

## 2025-03-08 ENCOUNTER — Ambulatory Visit: Admitting: Physician Assistant
